# Patient Record
Sex: Male | Born: 1995 | Hispanic: Yes | Marital: Single | Smoking: Never smoker
Health system: Southern US, Community
[De-identification: ages and names within clinical notes are randomized; demographics above are authoritative.]

---

## 2017-04-12 ENCOUNTER — Ambulatory Visit (INDEPENDENT_AMBULATORY_CARE_PROVIDER_SITE_OTHER): Payer: Self-pay | Admitting: Physician Assistant

## 2017-04-12 ENCOUNTER — Encounter (INDEPENDENT_AMBULATORY_CARE_PROVIDER_SITE_OTHER): Payer: Self-pay | Admitting: Physician Assistant

## 2017-04-12 VITALS — BP 109/70 | HR 78 | Temp 99.4°F | Resp 18 | Ht 67.0 in | Wt 233.0 lb

## 2017-04-12 DIAGNOSIS — Z114 Encounter for screening for human immunodeficiency virus [HIV]: Secondary | ICD-10-CM

## 2017-04-12 DIAGNOSIS — Z23 Encounter for immunization: Secondary | ICD-10-CM

## 2017-04-12 DIAGNOSIS — R41840 Attention and concentration deficit: Secondary | ICD-10-CM

## 2017-04-12 DIAGNOSIS — G479 Sleep disorder, unspecified: Secondary | ICD-10-CM

## 2017-04-12 MED ORDER — HYDROXYZINE HCL 25 MG PO TABS: 25.0000 mg | ORAL_TABLET | Freq: Every day | ORAL | 0 refills | Status: DC

## 2017-04-12 NOTE — Patient Instructions (Signed)

## 2017-04-12 NOTE — Progress Notes (Signed)
Subjective:  Patient ID: Mark Proctor, male    DOB: Jul 07, 1995  Age: 22 y.o. MRN: [id number]  CC: separated from reality  HPI Mark Proctor is a 22 y.o. male with no significant medical history presents with concern for lack of focus since approximately two months ago. Has been noticing that he randomly "zooms out". Says he could be driving or sitting in church when he loses focus. Says he feels separated from reality. Also feels his heart racing and is able to feel "the blood flowing through my body".  Also has trouble sleeping since approximately 1.5 months ago. Trouble maintaining sleep, feels "heart beating fast" and a "buzz" "as if I was dreaming or out of body". Has been researching his symptoms online and read that he may have depression or anxiety which he denies having. Smoked marijuana once in November which resulted in paranoia but says he will never smoke again. Reports recommencing the keto diet in January which helped him lose weight previously without the symptoms he is feeling now.  Does not endorse any other symptoms or complaints.     ROS Review of Systems  Constitutional: Negative for chills, fever and malaise/fatigue.  Eyes: Negative for blurred vision.  Respiratory: Negative for shortness of breath.   Cardiovascular: Negative for chest pain and palpitations.       Tachycardia  Gastrointestinal: Negative for abdominal pain and nausea.  Genitourinary: Negative for dysuria and hematuria.  Musculoskeletal: Negative for joint pain and myalgias.  Skin: Negative for rash.  Neurological: Negative for tingling and headaches.  Psychiatric/Behavioral: Negative for depression. The patient is nervous/anxious.     Objective:  BP 109/70 (BP Location: Left Arm, Patient Position: Sitting, Cuff Size: Large)   Pulse 78   Temp 99.4 F (37.4 C) (Oral)   Resp 18   Ht 5\' 7"  (1.702 m)   Wt 233 lb (105.7 kg)   SpO2 99%   BMI 36.49 kg/m   BP/Weight 04/12/2017  Systolic BP 109   Diastolic BP 70  Wt. (Lbs) 233  BMI 36.49      Physical Exam  Constitutional: He is oriented to person, place, and time.  Well developed, overweight, NAD, polite  HENT:  Head: Normocephalic and atraumatic.  Eyes: No scleral icterus.  Neck: Normal range of motion. Neck supple. No thyromegaly present.  Cardiovascular: Normal rate, regular rhythm and normal heart sounds.  Pulmonary/Chest: Effort normal and breath sounds normal.  Musculoskeletal: He exhibits no edema.  Neurological: He is alert and oriented to person, place, and time. No cranial nerve deficit. Coordination normal.  Skin: Skin is warm and dry. No rash noted. No erythema. No pallor.  Psychiatric: He has a normal mood and affect. His behavior is normal. Thought content normal.  Vitals reviewed.    Assessment & Plan:     1. Inattention - CBC with Differential - Comprehensive metabolic panel - TSH  2. Sleep disturbance - CBC with Differential - Comprehensive metabolic panel - TSH - hydrOXYzine (ATARAX/VISTARIL) 25 MG tablet; Take 1 tablet (25 mg total) by mouth at bedtime.  Dispense: 30 tablet; Refill: 0  3. Screening for HIV (human immunodeficiency virus) - HIV antibody  4. Need for Tdap vaccination - Tdap vaccine greater than or equal to 7yo IM  5. Need for prophylactic vaccination and inoculation against influenza - Flu Vaccine QUAD 6+ mos PF IM (Fluarix Quad PF)  * Pt's description of symptoms is vague and convoluted. I suspect there is likely anxiety but will wait  for lab results. Pt will return in two weeks.    Meds ordered this encounter  Medications  . hydrOXYzine (ATARAX/VISTARIL) 25 MG tablet    Sig: Take 1 tablet (25 mg total) by mouth at bedtime.    Dispense:  30 tablet    Refill:  0    Order Specific Question:   Supervising Provider    Answer:   Quentin AngstJEGEDE, OLUGBEMIGA E [id number]]    Follow-up: Return in about 2 weeks (around 04/26/2017).   Loletta Specteroger David Connie Lasater PA

## 2017-04-13 ENCOUNTER — Ambulatory Visit: Payer: Self-pay

## 2017-04-13 ENCOUNTER — Telehealth (INDEPENDENT_AMBULATORY_CARE_PROVIDER_SITE_OTHER): Payer: Self-pay | Admitting: *Deleted

## 2017-04-13 LAB — CBC WITH DIFFERENTIAL/PLATELET
BASOS: 0 %
Basophils Absolute: 0 10*3/uL (ref 0.0–0.2)
EOS (ABSOLUTE): 0 10*3/uL (ref 0.0–0.4)
EOS: 1 %
HEMATOCRIT: 48.3 % (ref 37.5–51.0)
Hemoglobin: 16.2 g/dL (ref 13.0–17.7)
Immature Grans (Abs): 0 10*3/uL (ref 0.0–0.1)
Immature Granulocytes: 0 %
Lymphocytes Absolute: 1.5 10*3/uL (ref 0.7–3.1)
Lymphs: 24 %
MCH: 29.1 pg (ref 26.6–33.0)
MCHC: 33.5 g/dL (ref 31.5–35.7)
MCV: 87 fL (ref 79–97)
MONOS ABS: 0.4 10*3/uL (ref 0.1–0.9)
Monocytes: 7 %
Neutrophils Absolute: 4.3 10*3/uL (ref 1.4–7.0)
Neutrophils: 68 %
Platelets: 235 10*3/uL (ref 150–379)
RBC: 5.57 x10E6/uL (ref 4.14–5.80)
RDW: 13.9 % (ref 12.3–15.4)
WBC: 6.4 10*3/uL (ref 3.4–10.8)

## 2017-04-13 LAB — COMPREHENSIVE METABOLIC PANEL
A/G RATIO: 1.7 (ref 1.2–2.2)
ALK PHOS: 79 IU/L (ref 39–117)
ALT: 23 IU/L (ref 0–44)
AST: 19 IU/L (ref 0–40)
Albumin: 5.1 g/dL (ref 3.5–5.5)
BILIRUBIN TOTAL: 0.7 mg/dL (ref 0.0–1.2)
BUN / CREAT RATIO: 12 (ref 9–20)
BUN: 12 mg/dL (ref 6–20)
CALCIUM: 10.4 mg/dL — AB (ref 8.7–10.2)
CO2: 21 mmol/L (ref 20–29)
Chloride: 104 mmol/L (ref 96–106)
Creatinine, Ser: 1.01 mg/dL (ref 0.76–1.27)
GFR calc Af Amer: 122 mL/min/{1.73_m2} (ref >59)
GFR, EST NON AFRICAN AMERICAN: 106 mL/min/{1.73_m2} (ref >59)
GLOBULIN, TOTAL: 3 g/dL (ref 1.5–4.5)
Glucose: 91 mg/dL (ref 65–99)
Potassium: 4.6 mmol/L (ref 3.5–5.2)
Sodium: 143 mmol/L (ref 134–144)
Total Protein: 8.1 g/dL (ref 6.0–8.5)

## 2017-04-13 LAB — HIV ANTIBODY (ROUTINE TESTING W REFLEX): HIV Screen 4th Generation wRfx: NONREACTIVE

## 2017-04-13 LAB — TSH: TSH: 2.21 u[IU]/mL (ref 0.450–4.500)

## 2017-04-13 NOTE — Telephone Encounter (Signed)
-----   Message from Loletta Specteroger David Gomez, PA-C sent at 04/13/2017 10:15 AM EST ----- Labs normal.

## 2017-04-13 NOTE — Telephone Encounter (Signed)
Medical Assistant left message on patient's home and cell voicemail. Voicemail states to give a call back to Nubia with [facility] at [phone]. Patient is aware of labs being normal  

## 2017-04-26 ENCOUNTER — Encounter (INDEPENDENT_AMBULATORY_CARE_PROVIDER_SITE_OTHER): Payer: Self-pay | Admitting: Physician Assistant

## 2017-04-26 ENCOUNTER — Ambulatory Visit (INDEPENDENT_AMBULATORY_CARE_PROVIDER_SITE_OTHER): Payer: Self-pay | Admitting: Physician Assistant

## 2017-04-26 VITALS — BP 118/73 | HR 78 | Temp 98.0°F | Resp 18 | Ht 67.0 in | Wt 237.0 lb

## 2017-04-26 DIAGNOSIS — F411 Generalized anxiety disorder: Secondary | ICD-10-CM

## 2017-04-26 DIAGNOSIS — R41840 Attention and concentration deficit: Secondary | ICD-10-CM

## 2017-04-26 DIAGNOSIS — G479 Sleep disorder, unspecified: Secondary | ICD-10-CM

## 2017-04-26 MED ORDER — ESCITALOPRAM OXALATE 10 MG PO TABS: 10.0000 mg | ORAL_TABLET | ORAL | 2 refills | Status: DC

## 2017-04-26 MED ORDER — HYDROXYZINE HCL 25 MG PO TABS: 25.0000 mg | ORAL_TABLET | Freq: Every day | ORAL | 0 refills | Status: DC

## 2017-04-26 NOTE — Patient Instructions (Addendum)
Panic Attacks Panic attacks are sudden, short feelings of great fear or discomfort. You may have them for no reason when you are relaxed, when you are uneasy (anxious), or when you are sleeping. Follow these instructions at home:  Take all your medicines as told.  Check with your doctor before starting new medicines.  Keep all doctor visits. Contact a doctor if:  You are not able to take your medicines as told.  Your symptoms do not get better.  Your symptoms get worse. Get help right away if:  Your attacks seem different than your normal attacks.  You have thoughts about hurting yourself or others.  You take panic attack medicine and you have a side effect. This information is not intended to replace advice given to you by your health care provider. Make sure you discuss any questions you have with your health care provider. Document Released: 03/18/2010 Document Revised: 07/22/2015 Document Reviewed: 09/27/2012 Elsevier Interactive Patient Education  2017 Elsevier Inc.  

## 2017-04-26 NOTE — Progress Notes (Signed)
Subjective:  Patient ID: Mark Proctor, male    DOB: [birth year]  Age: 22 y.o. MRN: [id number]  CC: f/u   HPI Mark Proctor is a 22 y.o. male with no significant medical history presents on f/u for inattention and sleep disturbance. CBC, CMP, TSH, and HIV normal/nonreactive. He was prescribed hydroxyzine 25 mg qhs x30 days. Says he is feeling better in regards to "zooming out", fast heartbeat, "buzzing", and the "out of body feeling". Symptoms were nearly everyday but are now approximately once per week. Does not endorse any other symptoms or complaints.     Outpatient Medications Prior to Visit  Medication Sig Dispense Refill  . hydrOXYzine (ATARAX/VISTARIL) 25 MG tablet Take 1 tablet (25 mg total) by mouth at bedtime. 30 tablet 0   No facility-administered medications prior to visit.      ROS Review of Systems  Constitutional: Negative for chills, fever and malaise/fatigue.  Eyes: Negative for blurred vision.  Respiratory: Negative for shortness of breath.   Cardiovascular: Negative for chest pain and palpitations.  Gastrointestinal: Negative for abdominal pain and nausea.  Genitourinary: Negative for dysuria and hematuria.  Musculoskeletal: Negative for joint pain and myalgias.  Skin: Negative for rash.  Neurological: Negative for tingling and headaches.  Psychiatric/Behavioral: Negative for depression. The patient is nervous/anxious and has insomnia.     Objective:  BP 118/73 (BP Location: Left Arm, Patient Position: Sitting, Cuff Size: Large)   Pulse 78   Temp 98 F (36.7 C) (Oral)   Resp 18   Ht 5\' 7"  (1.702 m)   Wt 237 lb (107.5 kg)   SpO2 99%   BMI 37.12 kg/m   BP/Weight 04/26/2017 04/12/2017  Systolic BP 118 109  Diastolic BP 73 70  Wt. (Lbs) 237 233  BMI 37.12 36.49      Physical Exam  Constitutional: He is oriented to person, place, and time.  Well developed, overweight, NAD, polite  HENT:  Head: Normocephalic and atraumatic.  Eyes: No scleral  icterus.  Neck: Normal range of motion. Neck supple. No thyromegaly present.  Cardiovascular: Normal rate, regular rhythm and normal heart sounds.  Pulmonary/Chest: Effort normal and breath sounds normal.  Musculoskeletal: He exhibits no edema.  Neurological: He is alert and oriented to person, place, and time. No cranial nerve deficit. Coordination normal.  Skin: Skin is warm and dry. No rash noted. No erythema. No pallor.  Psychiatric: He has a normal mood and affect. His behavior is normal. Thought content normal.  Vitals reviewed.    Assessment & Plan:    1. Anxiety state - Begin escitalopram 10 mg q am #30 1 refill  2. Inattention - Begin escitalopram 10 mg q am #30 1 refill  3. Sleep disturbance - hydrOXYzine (ATARAX/VISTARIL) 25 MG tablet; Take 1 tablet (25 mg total) by mouth at bedtime.  Dispense: 30 tablet; Refill: 0   Meds ordered this encounter  Medications  . hydrOXYzine (ATARAX/VISTARIL) 25 MG tablet    Sig: Take 1 tablet (25 mg total) by mouth at bedtime.    Dispense:  30 tablet    Refill:  0    Order Specific Question:   Supervising Provider    Answer:   Quentin Angst [id number]  . escitalopram (LEXAPRO) 10 MG tablet    Sig: Take 1 tablet (10 mg total) by mouth every morning.    Dispense:  30 tablet    Refill:  2    Order Specific Question:   Supervising Provider  AnswerQuentin Angst:   JEGEDE, OLUGBEMIGA E [[id number]]    Follow-up: Return in about 6 weeks (around 06/07/2017).   Loletta Specteroger David Gomez PA

## 2017-06-07 ENCOUNTER — Ambulatory Visit (INDEPENDENT_AMBULATORY_CARE_PROVIDER_SITE_OTHER): Payer: Self-pay | Admitting: Physician Assistant

## 2017-06-07 ENCOUNTER — Encounter (INDEPENDENT_AMBULATORY_CARE_PROVIDER_SITE_OTHER): Payer: Self-pay | Admitting: Physician Assistant

## 2017-06-07 ENCOUNTER — Other Ambulatory Visit: Payer: Self-pay

## 2017-06-07 VITALS — BP 106/68 | HR 61 | Temp 97.9°F | Ht 67.0 in | Wt 241.8 lb

## 2017-06-07 DIAGNOSIS — F419 Anxiety disorder, unspecified: Secondary | ICD-10-CM

## 2017-06-07 DIAGNOSIS — F329 Major depressive disorder, single episode, unspecified: Secondary | ICD-10-CM

## 2017-06-07 MED ORDER — ESCITALOPRAM OXALATE 10 MG PO TABS: 10.0000 mg | ORAL_TABLET | ORAL | 0 refills | Status: DC

## 2017-06-07 NOTE — Patient Instructions (Signed)
Living With Depression Everyone experiences occasional disappointment, sadness, and loss in their lives. When you are feeling down, blue, or sad for at least 2 weeks in a row, it may mean that you have depression. Depression can affect your thoughts and feelings, relationships, daily activities, and physical health. It is caused by changes in the way your brain functions. If you receive a diagnosis of depression, your health care provider will tell you which type of depression you have and what treatment options are available to you. If you are living with depression, there are ways to help you recover from it and also ways to prevent it from coming back. How to cope with lifestyle changes Coping with stress Stress is your body's reaction to life changes and events, both good and bad. Stressful situations may include:  Getting married.  The death of a spouse.  Losing a job.  Retiring.  Having a baby.  Stress can last just a few hours or it can be ongoing. Stress can play a major role in depression, so it is important to learn both how to cope with stress and how to think about it differently. Talk with your health care provider or a counselor if you would like to learn more about stress reduction. He or she may suggest some stress reduction techniques, such as:  Music therapy. This can include creating music or listening to music. Choose music that you enjoy and that inspires you.  Mindfulness-based meditation. This kind of meditation can be done while sitting or walking. It involves being aware of your normal breaths, rather than trying to control your breathing.  Centering prayer. This is a kind of meditation that involves focusing on a spiritual word or phrase. Choose a word, phrase, or sacred image that is meaningful to you and that brings you peace.  Deep breathing. To do this, expand your stomach and inhale slowly through your nose. Hold your breath for 3-5 seconds, then exhale  slowly, allowing your stomach muscles to relax.  Muscle relaxation. This involves intentionally tensing muscles then relaxing them.  Choose a stress reduction technique that fits your lifestyle and personality. Stress reduction techniques take time and practice to develop. Set aside 5-15 minutes a day to do them. Therapists can offer training in these techniques. The training may be covered by some insurance plans. Other things you can do to manage stress include:  Keeping a stress diary. This can help you learn what triggers your stress and ways to control your response.  Understanding what your limits are and saying no to requests or events that lead to a schedule that is too full.  Thinking about how you respond to certain situations. You may not be able to control everything, but you can control how you react.  Adding humor to your life by watching funny films or TV shows.  Making time for activities that help you relax and not feeling guilty about spending your time this way.  Medicines Your health care provider may suggest certain medicines if he or she feels that they will help improve your condition. Avoid using alcohol and other substances that may prevent your medicines from working properly (may interact). It is also important to:  Talk with your pharmacist or health care provider about all the medicines that you take, their possible side effects, and what medicines are safe to take together.  Make it your goal to take part in all treatment decisions (shared decision-making). This includes giving input on the side   effects of medicines. It is best if shared decision-making with your health care provider is part of your total treatment plan.  If your health care provider prescribes a medicine, you may not notice the full benefits of it for 4-8 weeks. Most people who are treated for depression need to be on medicine for at least 6-12 months after they feel better. If you are taking  medicines as part of your treatment, do not stop taking medicines without first talking to your health care provider. You may need to have the medicine slowly decreased (tapered) over time to decrease the risk of harmful side effects. Relationships Your health care provider may suggest family therapy along with individual therapy and drug therapy. While there may not be family problems that are causing you to feel depressed, it is still important to make sure your family learns as much as they can about your mental health. Having your family's support can help make your treatment successful. How to recognize changes in your condition Everyone has a different response to treatment for depression. Recovery from major depression happens when you have not had signs of major depression for two months. This may mean that you will start to:  Have more interest in doing activities.  Feel less hopeless than you did 2 months ago.  Have more energy.  Overeat less often, or have better or improving appetite.  Have better concentration.  Your health care provider will work with you to decide the next steps in your recovery. It is also important to recognize when your condition is getting worse. Watch for these signs:  Having fatigue or low energy.  Eating too much or too little.  Sleeping too much or too little.  Feeling restless, agitated, or hopeless.  Having trouble concentrating or making decisions.  Having unexplained physical complaints.  Feeling irritable, angry, or aggressive.  Get help as soon as you or your family members notice these symptoms coming back. How to get support and help from others How to talk with friends and family members about your condition Talking to friends and family members about your condition can provide you with one way to get support and guidance. Reach out to trusted friends or family members, explain your symptoms to them, and let them know that you are  working with a health care provider to treat your depression. Financial resources Not all insurance plans cover mental health care, so it is important to check with your insurance carrier. If paying for co-pays or counseling services is a problem, search for a local or county mental health care center. They may be able to offer public mental health care services at low or no cost when you are not able to see a private health care provider. If you are taking medicine for depression, you may be able to get the generic form, which may be less expensive. Some makers of prescription medicines also offer help to patients who cannot afford the medicines they need. Follow these instructions at home:  Get the right amount and quality of sleep.  Cut down on using caffeine, tobacco, alcohol, and other potentially harmful substances.  Try to exercise, such as walking or lifting small weights.  Take over-the-counter and prescription medicines only as told by your health care provider.  Eat a healthy diet that includes plenty of vegetables, fruits, whole grains, low-fat dairy products, and lean protein. Do not eat a lot of foods that are high in solid fats, added sugars, or salt.    Keep all follow-up visits as told by your health care provider. This is important. Contact a health care provider if:  You stop taking your antidepressant medicines, and you have any of these symptoms: ? Nausea. ? Headache. ? Feeling lightheaded. ? Chills and body aches. ? Not being able to sleep (insomnia).  You or your friends and family think your depression is getting worse. Get help right away if:  You have thoughts of hurting yourself or others. If you ever feel like you may hurt yourself or others, or have thoughts about taking your own life, get help right away. You can go to your nearest emergency department or call:  Your local emergency services (911 in the [place]  A suicide crisis helpline, such as the  National Suicide Prevention Lifeline at 1-800-273-8255. This is open 24-hours a day.  Summary  If you are living with depression, there are ways to help you recover from it and also ways to prevent it from coming back.  Work with your health care team to create a management plan that includes counseling, stress management techniques, and healthy lifestyle habits. This information is not intended to replace advice given to you by your health care provider. Make sure you discuss any questions you have with your health care provider. Document Released: 01/17/2016 Document Revised: 01/17/2016 Document Reviewed: 01/17/2016 Elsevier Interactive Patient Education  2018 Elsevier Inc.  

## 2017-06-07 NOTE — Progress Notes (Signed)
   Subjective:  Patient ID: Mark Proctor, male    DOB: [birth year]  Age: 22 y.o. MRN: [id number]  CC: anxiety  HPI Mark Proctor is a 22 y.o. male with a medical history of anxiety, inattention, and sleep disturbance presents to f/u on these same issues. He was prescribed Escitalopram 10 mg and hydroxyzine 25 mg nearly six weeks ago. Taking medications as directed. Mood is much better and he is sleeping much better, although, maybe a "little too much sleep" Inattention has resolved and focus is much better.        Outpatient Medications Prior to Visit  Medication Sig Dispense Refill  . escitalopram (LEXAPRO) 10 MG tablet Take 1 tablet (10 mg total) by mouth every morning. 30 tablet 2  . hydrOXYzine (ATARAX/VISTARIL) 25 MG tablet Take 1 tablet (25 mg total) by mouth at bedtime. 30 tablet 0   No facility-administered medications prior to visit.      ROS Review of Systems  Constitutional: Negative for chills, fever and malaise/fatigue.  Eyes: Negative for blurred vision.  Respiratory: Negative for shortness of breath.   Cardiovascular: Negative for chest pain and palpitations.  Gastrointestinal: Negative for abdominal pain and nausea.  Genitourinary: Negative for dysuria and hematuria.  Musculoskeletal: Negative for joint pain and myalgias.  Skin: Negative for rash.  Neurological: Negative for tingling and headaches.  Psychiatric/Behavioral: Negative for depression. The patient is not nervous/anxious.     Objective:  BP 106/68 (BP Location: Left Arm, Patient Position: Sitting, Cuff Size: Large)   Pulse 61   Temp 97.9 F (36.6 C) (Oral)   Ht 5\' 7"  (1.702 m)   Wt 241 lb 12.8 oz (109.7 kg)   SpO2 100%   BMI 37.87 kg/m   BP/Weight 06/07/2017 04/26/2017 04/12/2017  Systolic BP 106 118 109  Diastolic BP 68 73 70  Wt. (Lbs) 241.8 237 233  BMI 37.87 37.12 36.49      Physical Exam  Constitutional: He appears well-developed and well-nourished. No distress.  Eyes: No scleral  icterus.  Cardiovascular: Normal rate, regular rhythm and normal heart sounds.  Pulmonary/Chest: Effort normal and breath sounds normal.  Skin: He is not diaphoretic.  Psychiatric: He has a normal mood and affect. His behavior is normal. Thought content normal.     Assessment & Plan:     1. Anxiety and depression - Very good response to escitalopram 10 mg. - Refill escitalopram 10 mg x90 days,zero refills    Meds ordered this encounter  Medications  . escitalopram (LEXAPRO) 10 MG tablet    Sig: Take 1 tablet (10 mg total) by mouth every morning.    Dispense:  90 tablet    Refill:  0    Order Specific Question:   Supervising Provider    Answer:   Quentin AngstJEGEDE, OLUGBEMIGA E [[id number]]    Follow-up: Return in about 3 months (around 09/06/2017) for depression with anxiety.   Loletta Specteroger David Tniya Bowditch PA

## 2017-09-06 ENCOUNTER — Ambulatory Visit (INDEPENDENT_AMBULATORY_CARE_PROVIDER_SITE_OTHER): Payer: Self-pay | Admitting: Physician Assistant

## 2018-08-17 ENCOUNTER — Other Ambulatory Visit: Payer: Self-pay

## 2018-08-17 ENCOUNTER — Emergency Department (HOSPITAL_COMMUNITY): Payer: Self-pay

## 2018-08-17 ENCOUNTER — Emergency Department (HOSPITAL_COMMUNITY)
Admission: EM | Admit: 2018-08-17 | Discharge: 2018-08-17 | Disposition: A | Discharge status: Home / Self Care | Payer: Self-pay | Attending: Emergency Medicine | Admitting: Emergency Medicine

## 2018-08-17 DIAGNOSIS — S61011A Laceration without foreign body of right thumb without damage to nail, initial encounter: Secondary | ICD-10-CM | POA: Insufficient documentation

## 2018-08-17 DIAGNOSIS — Y929 Unspecified place or not applicable: Secondary | ICD-10-CM | POA: Insufficient documentation

## 2018-08-17 DIAGNOSIS — Y93G1 Activity, food preparation and clean up: Secondary | ICD-10-CM | POA: Insufficient documentation

## 2018-08-17 DIAGNOSIS — Y999 Unspecified external cause status: Secondary | ICD-10-CM | POA: Insufficient documentation

## 2018-08-17 DIAGNOSIS — W25XXXA Contact with sharp glass, initial encounter: Secondary | ICD-10-CM | POA: Insufficient documentation

## 2018-08-17 DIAGNOSIS — Z23 Encounter for immunization: Secondary | ICD-10-CM | POA: Insufficient documentation

## 2018-08-17 MED ORDER — LIDOCAINE HCL (PF) 1 % IJ SOLN
10.0000 mL | Freq: Once | INTRAMUSCULAR | Status: AC
  Administered 2018-08-17: 10 mL via INTRADERMAL
  Filled 2018-08-17: qty 10

## 2018-08-17 MED ORDER — BACITRACIN ZINC 500 UNIT/GM EX OINT: TOPICAL_OINTMENT | Freq: Once | CUTANEOUS | Status: AC

## 2018-08-17 MED ORDER — TETANUS-DIPHTH-ACELL PERTUSSIS 5-2.5-18.5 LF-MCG/0.5 IM SUSP
0.5000 mL | Freq: Once | INTRAMUSCULAR | Status: AC
  Administered 2018-08-17: 0.5 mL via INTRAMUSCULAR
  Filled 2018-08-17: qty 0.5

## 2018-08-17 NOTE — ED Notes (Signed)
Patient verbalizes understanding of discharge instructions. Opportunity for questioning and answers were provided. Armband removed by staff, pt discharged from ED.  

## 2018-08-17 NOTE — Discharge Instructions (Signed)
Keep the wound clean and dry for the first 24 hours. After that you may gently clean the wound with soap and water. Make sure to pat dry the wound before covering it with any dressing. You can use topical antibiotic ointment and bandage. Ice and elevate for pain relief.  ° °You can take Tylenol or Ibuprofen as directed for pain. You can alternate Tylenol and Ibuprofen every 4 hours for additional pain relief.  ° °Return to the Emergency Department, your primary care doctor, or the [facility] in 5-7 days for suture removal.  ° °Monitor closely for any signs of infection. Return to the Emergency Department for any worsening redness/swelling of the area that begins to spread, drainage from the site, worsening pain, fever or any other worsening or concerning symptoms.  ° ° °

## 2018-08-17 NOTE — ED Triage Notes (Signed)
Pt  Reports cleaning dishes at home and a glass broke.  Pt has about 2 in lac to right thumb with active bleeding

## 2018-08-17 NOTE — ED Provider Notes (Signed)
[facility] EMERGENCY DEPARTMENT Provider Note   CSN: [id number] Arrival date & time: 08/17/18  1703    History   Chief Complaint Chief Complaint  Patient presents with  . Laceration    HPI Mark Proctor is a 23 y.o. male who presents for evaluation of right thumb laceration that occurred just prior to ED arrival.  He reports that he was washing dishes when a dish broke and cut his thumb.  He states that he has applied pressure to help with bleeding.  He is not currently on blood thinners.  He does not know when his last tetanus shot was.  He denies any numbness/weakness.    The history is provided by the patient.    No past medical history on file.  There are no active problems to display for this patient.    The histories are not reviewed yet. Please review them in the "History" navigator section and refresh this [place].      Home Medications    Prior to Admission medications   Not on File    Family History No family history on file.  Social History Social History   Tobacco Use  . Smoking status: Not on file  Substance Use Topics  . Alcohol use: Not on file  . Drug use: Not on file     Allergies   Patient has no known allergies.   Review of Systems Review of Systems  Skin: Positive for wound.  Neurological: Negative for weakness and numbness.  All other systems reviewed and are negative.    Physical Exam Updated Vital Signs BP (!) 143/85 (BP Location: Right Arm)   Pulse 80   Temp 98.9 F (37.2 C) (Oral)   Resp (!) 22   Ht 5\' 6"  (1.676 m)   Wt 108.9 kg   SpO2 97%   BMI 38.74 kg/m   Physical Exam Vitals signs and nursing note reviewed.  Constitutional:      Appearance: He is well-developed.  HENT:     Head: Normocephalic and atraumatic.  Eyes:     General: No scleral icterus.       Right eye: No discharge.        Left eye: No discharge.     Conjunctiva/sclera: Conjunctivae normal.  Cardiovascular:     Pulses:           Radial pulses are 2+ on the right side and 2+ on the left side.  Pulmonary:     Effort: Pulmonary effort is normal.  Musculoskeletal:     Comments: Flexion/extension of thumb intact without any difficulty.  Opposition, abduction/abduction of thumb intact.  He is able to move the IP joint without any difficulty when held in isolation.  Skin:    General: Skin is warm and dry.     Capillary Refill: Capillary refill takes less than 2 seconds.     Comments: 2 cm linear laceration noted to the dorsal aspect of the right thumb in between the IP and the MCP joint. Good distal cap refill.  RUE is not dusky in appearance or cool to touch.  Neurological:     Mental Status: He is alert.  Psychiatric:        Speech: Speech normal.        Behavior: Behavior normal.      ED Treatments / Results  Labs (all labs ordered are listed, but only abnormal results are displayed) Labs Reviewed - No data to display  EKG None  Radiology  Dg Finger Thumb Right  Result Date: 08/17/2018 CLINICAL DATA:  Thumb laceration EXAM: RIGHT THUMB 2+V COMPARISON:  None. FINDINGS: There is no evidence of fracture or dislocation. There is no evidence of arthropathy or other focal bone abnormality. Soft tissues are unremarkable IMPRESSION: Negative. Electronically Signed   By: Jasmine PangKim  Fujinaga M.D.   On: 08/17/2018 17:38    Procedures .[name].Laceration Repair  Date/Time: 08/17/2018 6:18 PM Performed by: Maxwell CaulLayden, Lindsey A, PA-C Authorized by: Maxwell CaulLayden, Lindsey A, PA-C   Consent:    Consent obtained:  Verbal   Consent given by:  Patient   Risks discussed:  Infection, need for additional repair, pain, poor cosmetic result and poor wound healing   Alternatives discussed:  No treatment and delayed treatment Universal protocol:    Procedure explained and questions answered to patient or proxy's satisfaction: yes     Relevant documents present and verified: yes     Test results available and properly labeled: yes      Imaging studies available: yes     Required blood products, implants, devices, and special equipment available: yes     Site/side marked: yes     Immediately prior to procedure, a time out was called: yes     Patient identity confirmed:  Verbally with patient Anesthesia (see MAR for exact dosages):    Anesthesia method:  Local infiltration and nerve block   Local anesthetic:  Lidocaine 1% w/o epi   Block needle gauge:  25 G   Block anesthetic:  Lidocaine 1% w/o epi   Block injection procedure:  Anatomic landmarks identified, incremental injection, negative aspiration for blood and introduced needle   Block outcome:  Anesthesia achieved Laceration details:    Location:  Finger   Finger location:  R thumb   Length (cm):  2 Repair type:    Repair type:  Intermediate Pre-procedure details:    Preparation:  Patient was prepped and draped in usual sterile fashion Exploration:    Hemostasis achieved with:  Direct pressure   Wound exploration: wound explored through full range of motion     Wound extent: no foreign bodies/material noted and no tendon damage noted   Treatment:    Area cleansed with:  Betadine   Amount of cleaning:  Extensive   Irrigation solution:  Sterile saline   Irrigation method:  Syringe   Visualized foreign bodies/material removed: no   Skin repair:    Repair method:  Sutures   Suture size:  5-0   Suture material:  Nylon   Suture technique:  Simple interrupted   Number of sutures:  5 Approximation:    Approximation:  Close Post-procedure details:    Dressing:  Antibiotic ointment and non-adherent dressing   Patient tolerance of procedure:  Tolerated well, no immediate complications   (including critical care time)  Medications Ordered in ED Medications  lidocaine (PF) (XYLOCAINE) 1 % injection 10 mL (10 mLs Intradermal Given 08/17/18 1759)  Tdap (BOOSTRIX) injection 0.5 mL (0.5 mLs Intramuscular Given 08/17/18 1717)  bacitracin ointment ( Topical Given  08/17/18 1817)     Initial Impression / Assessment and Plan / ED Course  I have reviewed the triage vital signs and the nursing notes.  Pertinent labs & imaging results that were available during my care of the patient were reviewed by me and considered in my medical decision making (see chart for details).         23 year old male who presents for evaluation of right thumb laceration that occurred just  prior to ED arrival.  No numbness/weakness.  Tetanus is up-to-date. Patient is afebrile, non-toxic appearing, sitting comfortably on examination table. Vital signs reviewed and stable.  Patient is neurovascularly intact.  Patient with 2 cm wound noted to right thumb.  Will plan for x-ray, wound care, tetanus update.  X-ray reviewed.  Negative for any acute bony abnormality.  Laceration repaired as documented above.  Patient tolerated procedure well. At this time, patient exhibits no emergent life-threatening condition that require further evaluation in ED or admission. Patient had ample opportunity for questions and discussion. All patient's questions were answered with full understanding. Strict return precautions discussed. Patient expresses understanding and agreement to plan.   Portions of this note were generated with Scientist, clinical (histocompatibility and immunogenetics)Dragon dictation software. Dictation errors may occur despite best attempts at proofreading.   Final Clinical Impressions(s) / ED Diagnoses   Final diagnoses:  Laceration of right thumb without foreign body without damage to nail, initial encounter    ED Discharge Orders    None       Rosana HoesLayden, Lindsey A, PA-C 08/17/18 [id number]    Schlossman, Erin, MD 08/19/18 2121

## 2018-08-24 ENCOUNTER — Other Ambulatory Visit: Payer: Self-pay

## 2018-08-24 ENCOUNTER — Emergency Department (HOSPITAL_COMMUNITY)
Admission: EM | Admit: 2018-08-24 | Discharge: 2018-08-24 | Disposition: A | Discharge status: Home / Self Care | Payer: Self-pay | Attending: Emergency Medicine | Admitting: Emergency Medicine

## 2018-08-24 DIAGNOSIS — W268XXD Contact with other sharp object(s), not elsewhere classified, subsequent encounter: Secondary | ICD-10-CM | POA: Insufficient documentation

## 2018-08-24 DIAGNOSIS — S61011D Laceration without foreign body of right thumb without damage to nail, subsequent encounter: Secondary | ICD-10-CM | POA: Insufficient documentation

## 2018-08-24 DIAGNOSIS — Z5189 Encounter for other specified aftercare: Secondary | ICD-10-CM

## 2018-08-24 NOTE — Discharge Instructions (Addendum)
Stitches are not quite ready to come out today. Recommend recheck in 4 days.

## 2018-08-24 NOTE — ED Notes (Signed)
Patient verbalizes understanding of discharge instructions. Opportunity for questioning and answering were provided.  patient discharged from ED.  

## 2018-08-24 NOTE — ED Triage Notes (Signed)
Pt here for stitch removal to the right thumb; no s/s of infection noted

## 2018-08-24 NOTE — ED Provider Notes (Signed)
[facility] Provider Note   CSN: [id number] Arrival date & time: 08/24/18  1407    History   Chief Complaint Chief Complaint  Patient presents with  . Suture / Staple Removal    HPI Mark Proctor is a 23 y.o. male.     23 year old male presents for suture removal.  Patient seen 1 week ago with 5 sutures placed to his right thumb.  Area appears to be healing well, denies redness or drainage.  No difficulty with range of motion.  No complaints or concerns.     No past medical history on file.  There are no active problems to display for this patient.       Home Medications    Prior to Admission medications   Not on File    Family History No family history on file.  Social History Social History   Tobacco Use  . Smoking status: Not on file  Substance Use Topics  . Alcohol use: Not on file  . Drug use: Not on file     Allergies   Patient has no known allergies.   Review of Systems Review of Systems  Constitutional: Negative for fever.  Musculoskeletal: Negative for arthralgias, joint swelling and myalgias.  Skin: Positive for wound.  Allergic/Immunologic: Negative for immunocompromised state.  Neurological: Negative for weakness and numbness.  All other systems reviewed and are negative.    Physical Exam Updated Vital Signs BP 134/78   Pulse 72   Temp 98.6 F (37 C) (Oral)   Resp 16   SpO2 100%   Physical Exam Vitals signs and nursing note reviewed.  Constitutional:      General: He is not in acute distress.    Appearance: He is well-developed. He is not diaphoretic.  HENT:     Head: Normocephalic and atraumatic.  Cardiovascular:     Pulses: Normal pulses.  Pulmonary:     Effort: Pulmonary effort is normal.  Musculoskeletal:        General: No swelling, tenderness or deformity.       Hands:  Skin:    General: Skin is warm and dry.     Findings: No erythema or rash.  Neurological:   Mental Status: He is alert and oriented to person, place, and time.     Sensory: No sensory deficit.     Motor: No weakness.  Psychiatric:        Behavior: Behavior normal.      ED Treatments / Results  Labs (all labs ordered are listed, but only abnormal results are displayed) Labs Reviewed - No data to display  EKG None  Radiology No results found.  Procedures Procedures (including critical care time)  Medications Ordered in ED Medications - No data to display   Initial Impression / Assessment and Plan / ED Course  I have reviewed the triage vital signs and the nursing notes.  Pertinent labs & imaging results that were available during my care of the patient were reviewed by me and considered in my medical decision making (see chart for details).  Clinical Course as of Aug 24 1430  Sat Aug 24, 2018  [id number] 23 year old male presents for suture removal 7 days after laceration repair right thumb.  2 sutures were removed, noted small area of dehiscence, recommend sutures remain in place for additional 4 days with recheck at that time prior to removal.  No evidence of secondary infection.  Discussed proper wound care with patient.   [  LM]    Clinical Course User Index [LM] Tacy Learn, PA-C      Final Clinical Impressions(s) / ED Diagnoses   Final diagnoses:  Visit for wound check    ED Discharge Orders    None       Tacy Learn, PA-C 08/24/18 1432    Lajean Saver, MD 08/24/18 1721

## 2018-08-29 ENCOUNTER — Other Ambulatory Visit: Payer: Self-pay

## 2018-08-29 ENCOUNTER — Encounter (HOSPITAL_COMMUNITY): Payer: Self-pay | Admitting: Emergency Medicine

## 2018-08-29 ENCOUNTER — Emergency Department (HOSPITAL_COMMUNITY)
Admission: EM | Admit: 2018-08-29 | Discharge: 2018-08-29 | Disposition: A | Discharge status: Home / Self Care | Payer: Self-pay | Attending: Emergency Medicine | Admitting: Emergency Medicine

## 2018-08-29 DIAGNOSIS — S61011D Laceration without foreign body of right thumb without damage to nail, subsequent encounter: Secondary | ICD-10-CM | POA: Insufficient documentation

## 2018-08-29 DIAGNOSIS — X58XXXD Exposure to other specified factors, subsequent encounter: Secondary | ICD-10-CM | POA: Insufficient documentation

## 2018-08-29 DIAGNOSIS — Z4802 Encounter for removal of sutures: Secondary | ICD-10-CM | POA: Insufficient documentation

## 2018-08-29 NOTE — ED Triage Notes (Signed)
Pt was told to return for suture removal.

## 2018-08-29 NOTE — ED Notes (Signed)
ED Provider at bedside. 

## 2018-08-29 NOTE — ED Provider Notes (Signed)
[facility] EMERGENCY DEPARTMENT Provider Note   CSN: [id number] Arrival date & time: 08/29/18  2229    History   Chief Complaint Chief Complaint  Patient presents with  . Suture / Staple Removal    HPI Mark Proctor is a 23 y.o. male who presents to the emergency department for suture removal. On 6/20, patient was seen in the emergency department and 5 sutures placed to his right thumb. He returned to the emergency department on 08/24/2018 for suture removal.  Two sutures were removed 08/24/2018 but there was a small area of wound dehiscence present. It was recommended that patient return in 4 days for a wound recheck and possible suture removal.  They, patient states that his wound has been healing well.  He denies any fever, chills, redness around the wound, or drainage from the wound.  He reports that he has good range of motion of his right thumb.  No known sick contacts.  No medications prior to arrival.     The history is provided by the patient. No language interpreter was used.    History reviewed. No pertinent past medical history.  There are no active problems to display for this patient.   History reviewed. No pertinent surgical history.      Home Medications    Prior to Admission medications   Not on File    Family History No family history on file.  Social History Social History   Tobacco Use  . Smoking status: Not on file  Substance Use Topics  . Alcohol use: Not on file  . Drug use: Not on file     Allergies   Patient has no known allergies.   Review of Systems Review of Systems  Constitutional: Negative for activity change, appetite change, fever and unexpected weight change.  Skin: Positive for wound.  All other systems reviewed and are negative.    Physical Exam Updated Vital Signs BP (!) 160/91   Pulse 85   Temp 98.8 F (37.1 C)   Resp 19   SpO2 100%   Physical Exam Vitals signs and nursing note reviewed.   Constitutional:      General: He is not in acute distress.    Appearance: Normal appearance. He is well-developed. He is not toxic-appearing.  HENT:     Head: Normocephalic and atraumatic.     Right Ear: Tympanic membrane and external ear normal.     Left Ear: Tympanic membrane and external ear normal.     Nose: Nose normal.     Mouth/Throat:     Pharynx: Uvula midline.  Eyes:     General: Lids are normal. No scleral icterus.    Conjunctiva/sclera: Conjunctivae normal.     Pupils: Pupils are equal, round, and reactive to light.  Neck:     Musculoskeletal: Full passive range of motion without pain and neck supple.  Cardiovascular:     Rate and Rhythm: Normal rate.     Heart sounds: Normal heart sounds. No murmur.  Pulmonary:     Effort: Pulmonary effort is normal.     Breath sounds: Normal breath sounds.  Abdominal:     General: Bowel sounds are normal.     Palpations: Abdomen is soft.     Tenderness: There is no abdominal tenderness.  Musculoskeletal: Normal range of motion.     Right wrist: Normal.     Right hand: He exhibits laceration.       Hands:     Comments:  Moving all extremities without difficulty.   Lymphadenopathy:     Cervical: No cervical adenopathy.  Skin:    General: Skin is warm and dry.     Capillary Refill: Capillary refill takes less than 2 seconds.     Findings: Laceration present.     Comments: Well healed laceration present to right thumb with three sutures in place.  Neurological:     Mental Status: He is alert and oriented to person, place, and time.     Coordination: Coordination is intact.     Gait: Gait is intact.      ED Treatments / Results  Labs (all labs ordered are listed, but only abnormal results are displayed) Labs Reviewed - No data to display  EKG None  Radiology No results found.  Procedures .Suture Removal  Date/Time: 08/29/2018 11:22 PM Performed by: Sherrilee GillesScoville, Brittany N, NP Authorized by: Sherrilee GillesScoville, Brittany N, NP    Consent:    Consent obtained:  Verbal   Consent given by:  Patient   Risks discussed:  Pain, wound separation and bleeding   Alternatives discussed:  No treatment Universal protocol:    Site/side marked: yes     Immediately prior to procedure, a time out was called: yes     Patient identity confirmed:  Verbally with patient and arm band Location:    Location:  Upper extremity   Upper extremity location:  Hand   Hand location:  R thumb Procedure details:    Wound appearance:  No signs of infection, good wound healing and clean   Number of sutures removed:  3 Post-procedure details:    Post-removal:  Antibiotic ointment applied and Band-Aid applied   Patient tolerance of procedure:  Tolerated well, no immediate complications   (including critical care time)  Medications Ordered in ED Medications - No data to display   Initial Impression / Assessment and Plan / ED Course  I have reviewed the triage vital signs and the nursing notes.  Pertinent labs & imaging results that were available during my care of the patient were reviewed by me and considered in my medical decision making (see chart for details).        23 year old male who presents for suture removal.  Laceration on patient's right thumb appears well healed.  No fevers or systemic symptoms.  He has good range of motion and brisk CR of his right thumb.  Sutures were removed without immediate complication, see procedure note above for details.  Discussed proper wound care as well as signs and symptoms of wound infection.  Patient verbalizes understanding and was discharged home stable and in good condition.  Discussed supportive care as well as need for f/u w/ PCP in the next 1-2 days.  Also discussed sx that warrant sooner re-evaluation in emergency department. Family / patient/ caregiver informed of clinical course, understand medical decision-making process, and agree with plan.  Final Clinical Impressions(s) / ED  Diagnoses   Final diagnoses:  Visit for suture removal    ED Discharge Orders    None       Sherrilee GillesScoville, Brittany N, NP 08/29/18 2324    Niel HummerKuhner, Ross, MD 08/31/18 [phone]

## 2018-11-06 ENCOUNTER — Encounter (INDEPENDENT_AMBULATORY_CARE_PROVIDER_SITE_OTHER): Payer: Self-pay | Admitting: Physician Assistant

## 2018-11-14 ENCOUNTER — Ambulatory Visit (INDEPENDENT_AMBULATORY_CARE_PROVIDER_SITE_OTHER): Payer: Self-pay | Admitting: Primary Care

## 2018-12-05 ENCOUNTER — Encounter (INDEPENDENT_AMBULATORY_CARE_PROVIDER_SITE_OTHER): Payer: Self-pay | Admitting: Primary Care

## 2018-12-05 ENCOUNTER — Other Ambulatory Visit: Payer: Self-pay

## 2018-12-05 ENCOUNTER — Ambulatory Visit (INDEPENDENT_AMBULATORY_CARE_PROVIDER_SITE_OTHER): Payer: Self-pay | Admitting: Primary Care

## 2018-12-05 ENCOUNTER — Telehealth (INDEPENDENT_AMBULATORY_CARE_PROVIDER_SITE_OTHER): Payer: Self-pay | Admitting: Primary Care

## 2018-12-05 DIAGNOSIS — Z0000 Encounter for general adult medical examination without abnormal findings: Secondary | ICD-10-CM

## 2018-12-05 DIAGNOSIS — G479 Sleep disorder, unspecified: Secondary | ICD-10-CM

## 2018-12-05 DIAGNOSIS — F329 Major depressive disorder, single episode, unspecified: Secondary | ICD-10-CM

## 2018-12-05 DIAGNOSIS — F32A Depression, unspecified: Secondary | ICD-10-CM

## 2018-12-05 DIAGNOSIS — F419 Anxiety disorder, unspecified: Secondary | ICD-10-CM

## 2018-12-05 MED ORDER — ESCITALOPRAM OXALATE 10 MG PO TABS: 10.0000 mg | ORAL_TABLET | Freq: Every day | ORAL | 1 refills | Status: DC

## 2018-12-05 NOTE — Progress Notes (Signed)
Virtual Visit via Telephone Note  I connected with Mark Proctor on 12/05/18 at  4:10 PM EDT by telephone and verified that I am speaking with the correct person using two identifiers.   I discussed the limitations, risks, security and privacy concerns of performing an evaluation and management service by telephone and the availability of in person appointments. I also discussed with the patient that there may be a patient responsible charge related to this service. The patient expressed understanding and agreed to proceed.   History of Present Illness: [name] "Mark Proctor" is having a web visit today. He is estabishling care. His main concerns are mood changes which are described as swings from happy to sad. Also, when he drinks alcohol he says things to his friends that he normally would not say.   History reviewed. No pertinent past medical history. Observations/Objective: Review of Systems  Psychiatric/Behavioral: Positive for depression. The patient is nervous/anxious.     Assessment and Plan: Mark Proctor was seen today for anxiety.  Diagnoses and all orders for this visit:  Anxiety and depression  We discussed options for treatment of anxiety including therapy and/or medication.  Will check basic labs to ensure thyroid is in normal range and that no other metabolic issues are obvious.  Reviewed concept of anxiety as biochemical imbalance of neurotransmitters and rationale for treatment. Discussed potential risks, expected benefits, possible side effects of the medicine. We also discussed how to take it correctly and dosing instructions. If he has any significant side effects to the medicine, he is to stop it and call for advice.  Instructed patient to contact office or on-call physician promptly should condition worsen or any new symptoms appear.    He was agreeable with this plan.  Patient will be started on Lexapro 10mg  and follow up 6 weeks will  be to follow up on effectiveness of medication and to also see CSW at his next visit  Encounter for medical examination to establish care , NP-C will be your  (PCP) that will  provides both the first contact for a person with an undiagnosed health concern as well as continuing care of varied medical conditions, not limited by cause, organ system, or diagnosis.   Sleep disturbance Lexpro 10mg  will sever as a dual purpose side affect is drowsiness and may promote sleep. Insomnia Insomnia is frequent trouble falling and/or staying asleep. Insomnia can be a long term problem or a short term problem. Both are common. Insomnia can be a short term problem when the wakefulness is related to a certain stress or worry. Long term insomnia is often related to ongoing stress during waking hours and/or poor sleeping habits. Overtime, sleep deprivation itself can make the problem worse. Every little thing feels more severe because you are overtired and your ability to cope is decreased. CAUSES   Stress, anxiety, and depression.  Poor sleeping habits.  Distractions such as TV in the bedroom.  Naps close to bedtime.  Engaging in emotionally charged conversations before bed.  Technical reading before sleep.  Alcohol and other sedatives. They may make the problem worse. They can hurt normal sleep patterns and normal dream activity.  Stimulants such as caffeine for several hours prior to bedtime.  Pain syndromes and shortness of breath can cause insomnia.  Exercise late at night.  Changing time zones may cause sleeping problems (jet lag). It is sometimes helpful to have someone observe your sleeping patterns. They should look for periods of  not breathing during the night (sleep apnea). They should also look to see how long those periods last. If you live alone or observers are uncertain, you can also be observed at a sleep clinic where your sleep patterns will be professionally monitored.  Sleep apnea requires a checkup and treatment. Give your caregivers your medical history. Give your caregivers observations your family has made about your sleep.  SYMPTOMS   Not feeling rested in the morning.  Anxiety and restlessness at bedtime.  Difficulty falling and staying asleep. TREATMENT   Your caregiver may prescribe treatment for an underlying medical disorders. Your caregiver can give advice or help if you are using alcohol or other drugs for self-medication. Treatment of underlying problems will usually eliminate insomnia problems.  Medications can be prescribed for short time use. They are generally not recommended for lengthy use.  Over-the-counter sleep medicines are not recommended for lengthy use. They can be habit forming.  You can promote easier sleeping by making lifestyle changes such as:  Using relaxation techniques that help with breathing and reduce muscle tension.  Exercising earlier in the day.  Changing your diet and the time of your last meal. No night time snacks.  Establish a regular time to go to bed.  Counseling can help with stressful problems and worry.  Soothing music and white noise may be helpful if there are background noises you cannot remove.  Stop tedious detailed work at least one hour before bedtime. HOME CARE INSTRUCTIONS   Keep a diary. Inform your caregiver about your progress. This includes any medication side effects. See your caregiver regularly. Take note of:  Times when you are asleep.  Times when you are awake during the night.  The quality of your sleep.  How you feel the next day. This information will help your caregiver care for you.  Get out of bed if you are still awake after 15 minutes. Read or do some quiet activity. Keep the lights down. Wait until you feel sleepy and go back to bed.  Keep regular sleeping and waking hours. Avoid naps.  Exercise regularly.  Avoid distractions at bedtime. Distractions  include watching television or engaging in any intense or detailed activity like attempting to balance the household checkbook.  Develop a bedtime ritual. Keep a familiar routine of bathing, brushing your teeth, climbing into bed at the same time each night, listening to soothing music. Routines increase the success of falling to sleep faster.  Use relaxation techniques. This can be using breathing and muscle tension release routines. It can also include visualizing peaceful scenes. You can also help control troubling or intruding thoughts by keeping your mind occupied with boring or repetitive thoughts like the old concept of counting sheep. You can make it more creative like imagining planting one beautiful flower after another in your backyard garden.  During your day, work to eliminate stress. When this is not possible use some of the previous suggestions to help reduce the anxiety that accompanies stressful situations. MAKE SURE YOU:   Understand these instructions.  Will watch your condition.  Will get help right away if you are not doing well or get worse. Document Released: 02/11/2000 Document Revised: 05/08/2011 Document Reviewed: 03/13/2007 [facility] Patient Information 2015 [place], [place]. This information is not intended to replace advice given to you by your health care provider. Make sure you discuss any questions you have with your health care provider. Other orders -     escitalopram (LEXAPRO) 10 MG tablet; Take  1 tablet (10 mg total) by mouth at bedtime.    Follow Up Instructions:    I discussed the assessment and treatment plan with the patient. The patient was provided an opportunity to ask questions and all were answered. The patient agreed with the plan and demonstrated an understanding of the instructions.   The patient was advised to call back or seek an in-person evaluation if the symptoms worsen or if the condition fails to improve as anticipated.  I provided 26  minutes of non-face-to-face time during this encounter.   Grayce SessionsMichelle P Joshwa Hemric, NP

## 2019-01-14 ENCOUNTER — Ambulatory Visit (INDEPENDENT_AMBULATORY_CARE_PROVIDER_SITE_OTHER): Payer: Self-pay | Admitting: Primary Care

## 2019-01-14 ENCOUNTER — Ambulatory Visit (INDEPENDENT_AMBULATORY_CARE_PROVIDER_SITE_OTHER): Payer: Self-pay | Admitting: Licensed Clinical Social Worker

## 2019-01-14 ENCOUNTER — Other Ambulatory Visit: Payer: Self-pay

## 2019-01-14 ENCOUNTER — Encounter (INDEPENDENT_AMBULATORY_CARE_PROVIDER_SITE_OTHER): Payer: Self-pay | Admitting: Primary Care

## 2019-01-14 VITALS — BP 126/79 | HR 62 | Temp 97.1°F | Ht 66.0 in | Wt 256.2 lb

## 2019-01-14 DIAGNOSIS — F419 Anxiety disorder, unspecified: Secondary | ICD-10-CM

## 2019-01-14 DIAGNOSIS — F439 Reaction to severe stress, unspecified: Secondary | ICD-10-CM

## 2019-01-14 DIAGNOSIS — F329 Major depressive disorder, single episode, unspecified: Secondary | ICD-10-CM

## 2019-01-14 MED ORDER — ESCITALOPRAM OXALATE 10 MG PO TABS: 10.0000 mg | ORAL_TABLET | Freq: Every day | ORAL | 1 refills | Status: DC

## 2019-01-14 NOTE — Patient Instructions (Signed)
Can try melatonin 5mg-15 mg at night for sleep, can also do benadryl 25-50mg at night for sleep.  If this does not help we can try prescription medication.  Also here is some information about good sleep hygiene.   Insomnia Insomnia is frequent trouble falling and/or staying asleep. Insomnia can be a long term problem or a short term problem. Both are common. Insomnia can be a short term problem when the wakefulness is related to a certain stress or worry. Long term insomnia is often related to ongoing stress during waking hours and/or poor sleeping habits. Overtime, sleep deprivation itself can make the problem worse. Every little thing feels more severe because you are overtired and your ability to cope is decreased. CAUSES  Stress, anxiety, and depression. Poor sleeping habits. Distractions such as TV in the bedroom. Naps close to bedtime. Engaging in emotionally charged conversations before bed. Technical reading before sleep. Alcohol and other sedatives. They may make the problem worse. They can hurt normal sleep patterns and normal dream activity. Stimulants such as caffeine for several hours prior to bedtime. Pain syndromes and shortness of breath can cause insomnia. Exercise late at night. Changing time zones may cause sleeping problems (jet lag). It is sometimes helpful to have someone observe your sleeping patterns. They should look for periods of not breathing during the night (sleep apnea). They should also look to see how long those periods last. If you live alone or observers are uncertain, you can also be observed at a sleep clinic where your sleep patterns will be professionally monitored. Sleep apnea requires a checkup and treatment. Give your caregivers your medical history. Give your caregivers observations your family has made about your sleep.  SYMPTOMS  Not feeling rested in the morning. Anxiety and restlessness at bedtime. Difficulty falling and staying asleep. TREATMENT   Your caregiver may prescribe treatment for an underlying medical disorders. Your caregiver can give advice or help if you are using alcohol or other drugs for self-medication. Treatment of underlying problems will usually eliminate insomnia problems. Medications can be prescribed for short time use. They are generally not recommended for lengthy use. Over-the-counter sleep medicines are not recommended for lengthy use. They can be habit forming. You can promote easier sleeping by making lifestyle changes such as: Using relaxation techniques that help with breathing and reduce muscle tension. Exercising earlier in the day. Changing your diet and the time of your last meal. No night time snacks. Establish a regular time to go to bed. Counseling can help with stressful problems and worry. Soothing music and white noise may be helpful if there are background noises you cannot remove. Stop tedious detailed work at least one hour before bedtime. HOME CARE INSTRUCTIONS  Keep a diary. Inform your caregiver about your progress. This includes any medication side effects. See your caregiver regularly. Take note of: Times when you are asleep. Times when you are awake during the night. The quality of your sleep. How you feel the next day. This information will help your caregiver care for you. Get out of bed if you are still awake after 15 minutes. Read or do some quiet activity. Keep the lights down. Wait until you feel sleepy and go back to bed. Keep regular sleeping and waking hours. Avoid naps. Exercise regularly. Avoid distractions at bedtime. Distractions include watching television or engaging in any intense or detailed activity like attempting to balance the household checkbook. Develop a bedtime ritual. Keep a familiar routine of bathing, brushing your teeth,   climbing into bed at the same time each night, listening to soothing music. Routines increase the success of falling to sleep faster. Use  relaxation techniques. This can be using breathing and muscle tension release routines. It can also include visualizing peaceful scenes. You can also help control troubling or intruding thoughts by keeping your mind occupied with boring or repetitive thoughts like the old concept of counting sheep. You can make it more creative like imagining planting one beautiful flower after another in your backyard garden. During your day, work to eliminate stress. When this is not possible use some of the previous suggestions to help reduce the anxiety that accompanies stressful situations. MAKE SURE YOU:  Understand these instructions. Will watch your condition. Will get help right away if you are not doing well or get worse. Document Released: 02/11/2000 Document Revised: 05/08/2011 Document Reviewed: 03/13/2007 [employer] Patient Information 2015 [employer]. This information is not intended to replace advice given to you by your health care provider. Make sure you discuss any questions you have with your health care provider.  

## 2019-01-14 NOTE — Progress Notes (Signed)
Established Patient Office Visit  Subjective:  Patient ID: Mark Proctor, male    DOB: 05-31-1995  Age: 23 y.o. MRN: [id number]  CC:  Chief Complaint  Patient presents with  . Depression  . Anxiety    HPI Mark Proctor presents for evaluation on Lexapro he has seen improvement in feeling better , sleeping better but notices he is tired all the time. However he works 52 hours a week if he takes a nap during the day he can not sleep at night suggested exercising.   No past medical history on file.  No past surgical history on file.  No family history on file.  Social History   Socioeconomic History  . Marital status: Single    Spouse name: Not on file  . Number of children: Not on file  . Years of education: Not on file  . Highest education level: Not on file  Occupational History  . Not on file  Social Needs  . Financial resource strain: Not on file  . Food insecurity    Worry: Not on file    Inability: Not on file  . Transportation needs    Medical: Not on file    Non-medical: Not on file  Tobacco Use  . Smoking status: Never Smoker  . Smokeless tobacco: Never Used  Substance and Sexual Activity  . Alcohol use: Yes    Frequency: Never  . Drug use: No  . Sexual activity: Not Currently  Lifestyle  . Physical activity    Days per week: Not on file    Minutes per session: Not on file  . Stress: Not on file  Relationships  . Social Herbalist on phone: Not on file    Gets together: Not on file    Attends religious service: Not on file    Active member of club or organization: Not on file    Attends meetings of clubs or organizations: Not on file    Relationship status: Not on file  . Intimate partner violence    Fear of current or ex partner: Not on file    Emotionally abused: Not on file    Physically abused: Not on file    Forced sexual activity: Not on file  Other Topics Concern  . Not on file  Social History  Narrative   ** Merged History Encounter **        Outpatient Medications Prior to Visit  Medication Sig Dispense Refill  . escitalopram (LEXAPRO) 10 MG tablet Take 1 tablet (10 mg total) by mouth at bedtime. 30 tablet 1   No facility-administered medications prior to visit.     No Known Allergies  ROS Review of Systems  Constitutional: Positive for fatigue.  Psychiatric/Behavioral: Positive for sleep disturbance.  All other systems reviewed and are negative.     Objective:    Physical Exam  Constitutional: He is oriented to person, place, and time. He appears well-developed and well-nourished.  Neck: Neck supple.  Cardiovascular: Normal rate and regular rhythm.  Pulmonary/Chest: Effort normal and breath sounds normal.  Abdominal: Soft. Bowel sounds are normal. He exhibits distension.  Musculoskeletal: Normal range of motion.  Neurological: He is oriented to person, place, and time.  Psychiatric: He has a normal mood and affect. His behavior is normal.    BP 126/79 (BP Location: Left Arm, Patient Position: Sitting, Cuff Size: Large)   Pulse 62   Temp (!) 97.1 F (36.2 C) (Temporal)  Ht 5\' 6"  (1.676 m)   Wt 256 lb 3.2 oz (116.2 kg)   SpO2 98%   BMI 41.35 kg/m  Wt Readings from Last 3 Encounters:  01/14/19 256 lb 3.2 oz (116.2 kg)  08/17/18 240 lb (108.9 kg)  06/07/17 241 lb 12.8 oz (109.7 kg)     Health Maintenance Due  Topic Date Due  . INFLUENZA VACCINE  09/28/2018    There are no preventive care reminders to display for this patient.  Lab Results  Component Value Date   TSH 2.210 04/12/2017   Lab Results  Component Value Date   WBC 6.4 04/12/2017   HGB 16.2 04/12/2017   HCT 48.3 04/12/2017   MCV 87 04/12/2017   PLT 235 04/12/2017   Lab Results  Component Value Date   NA 143 04/12/2017   K 4.6 04/12/2017   CO2 21 04/12/2017   GLUCOSE 91 04/12/2017   BUN 12 04/12/2017   CREATININE 1.01 04/12/2017   BILITOT 0.7 04/12/2017   ALKPHOS 79  04/12/2017   AST 19 04/12/2017   ALT 23 04/12/2017   PROT 8.1 04/12/2017   ALBUMIN 5.1 04/12/2017   CALCIUM 10.4 (H) 04/12/2017   No results found for: CHOL No results found for: HDL No results found for: LDLCALC No results found for: TRIG No results found for: CHOLHDL No results found for: 04/14/2017    Assessment & Plan:  Mark Proctor was seen today for depression and anxiety.  Diagnoses and all orders for this visit:  Anxiety and depressiongettin Patient is expressing his depression is a lot better since he has started on Lexapro  feeling down, blue or irritability has decreased. He will also following up with the CSW. Other orders -     escitalopram (LEXAPRO) 10 MG tablet; Take 1 tablet (10 mg total) by mouth at bedtime.continue with no changes.    No orders of the defined types were placed in this encounter.   Follow-up: No follow-ups on file.    Elita Quick, NP

## 2019-01-17 NOTE — BH Specialist Note (Signed)
Integrated Behavioral Health Initial Visit  MRN: [id number] Name: Mark Proctor  Number of [place] Clinician visits:: 1/6 Session Start time: 10:00 AM  Session End time: 10:30 AM Total time: 30  Type of Service: [place] Interpretor:No. Interpretor Name and Language: NA   Warm Hand Off Completed.       SUBJECTIVE: Mark Proctor is a 23 y.o. male accompanied by self Patient was referred by NP Oletta Lamas for depression and anxiety. Patient reports the following symptoms/concerns: Pt reports increase in irritability, low energy, and panic attacks triggered by work stress Duration of problem: Ongoing; Severity of problem: mild  OBJECTIVE: Mood: Anxious and Pleasant and Affect: Appropriate Risk of harm to self or others: No plan to harm self or others  LIFE CONTEXT: Family and Social: Pt reports strong support from family and friends School/Work: Pt is employed Self-Care: Pt enjoys listening to music and videos online to cope with stressors. He also utilizes essential oils Life Changes: Pt is experiencing work stress resulting in symptoms of depression and anxiety  GOALS ADDRESSED: Patient will: 1. Reduce symptoms of: agitation, anxiety, depression and stress 2. Increase knowledge and/or ability of: coping skills and healthy habits  3. Demonstrate ability to: Increase healthy adjustment to current life circumstances and Increase adequate support systems for patient/family  INTERVENTIONS: Interventions utilized: Mindfulness or Psychologist, educational, Supportive Counseling and Psychoeducation and/or Health Education  Standardized Assessments completed: GAD-7 and PHQ 2&9  ASSESSMENT: Patient currently experiencing increase in irritability, low energy, and panic attacks triggered by work stress. He receives strong support from family and friends.    Patient may benefit from continued medication management.  LCSW discussed how stress can negatively impact our mental and physical health, in addition, to healthy coping skills to assist in management or decrease of stressors. Pt successfully identified strategies that he can utilize on a routine basis.   PLAN: 1. Follow up with behavioral health clinician on : Contact LCSW with any behavioral health or resource needs 2. Behavioral recommendations: Continue medication management and utilize healthy coping skills discussed 3. Referral(s): [place] (In Clinic) 4. "From scale of 1-10, how likely are you to follow plan?": 10  Rebekah Chesterfield, LCSW 01/17/2019 11:48 AM

## 2019-07-15 ENCOUNTER — Other Ambulatory Visit: Payer: Self-pay

## 2019-07-15 ENCOUNTER — Ambulatory Visit (INDEPENDENT_AMBULATORY_CARE_PROVIDER_SITE_OTHER): Payer: Self-pay | Admitting: Licensed Clinical Social Worker

## 2019-07-15 ENCOUNTER — Telehealth (INDEPENDENT_AMBULATORY_CARE_PROVIDER_SITE_OTHER): Payer: Self-pay | Admitting: Primary Care

## 2019-07-15 DIAGNOSIS — F419 Anxiety disorder, unspecified: Secondary | ICD-10-CM

## 2019-07-15 DIAGNOSIS — F329 Major depressive disorder, single episode, unspecified: Secondary | ICD-10-CM

## 2019-07-15 MED ORDER — ESCITALOPRAM OXALATE 10 MG PO TABS: 10.0000 mg | ORAL_TABLET | Freq: Every day | ORAL | 1 refills | Status: DC

## 2019-07-15 NOTE — Progress Notes (Signed)
Virtual Visit via Telephone Note  I connected with Mark Proctor on 07/15/19 at 10:30 AM EDT by telephone and verified that I am speaking with the correct person using two identifiers.   I discussed the limitations, risks, security and privacy concerns of performing an evaluation and management service by telephone and the availability of in person appointments. I also discussed with the patient that there may be a patient responsible charge related to this service. The patient expressed understanding and agreed to proceed.   History of Present Illness: Ms. Mark Proctor is a 24 year old man having a follow up appointment for effectiveness of lexapro. He states he is feeling an improvement and does not feel a increase is needed. He is following up with CSW. Past Medical History   Depression  Anxiety Observations/Objective: Review of Systems  All other systems reviewed and are negative.   Assessment and Plan: Diagnoses and all orders for this visit: Diagnoses and all orders for this visit:  Anxiety and depression Anxiety and depression SRI Side Effects: Lexapro 10 at bedtime  GI Upset:  no Change in Appetite:  no Daytime Drowsiness:  yes Sleep Issues:  no Headaches:  no Dizziness:  no Tremor:  no Heart Palpitations:  no Sweating:  no Irritability:  yes Decreased Libido:  no Patient compliant with medication:  yes Suicidal Ideation:  no Self Harm:  no -     escitalopram (LEXAPRO) 10 MG tablet; Take 1 tablet (10 mg total) by mouth at bedtime.  Other orders -     escitalopram (LEXAPRO) 10 MG tablet; Take 1 tablet (10 mg total) by mouth at bedtime.    Follow Up Instructions:    I discussed the assessment and treatment plan with the patient. The patient was provided an opportunity to ask questions and all were answered. The patient agreed with the plan and demonstrated an understanding of the instructions.   The patient was advised to call back or  seek an in-person evaluation if the symptoms worsen or if the condition fails to improve as anticipated.  I provided 10 minutes of non-face-to-face time during this encounter.   Grayce Sessions, NP

## 2019-08-11 NOTE — BH Specialist Note (Signed)
Integrated Behavioral Health Follow Up Visit  MRN: [id number] Name: Mark Proctor  Number of Integrated Behavioral Health Clinician visits: 2/6 Session Start time: 11:00 AM  Session End time: 11:10 AM Total time: 10  Type of Service: Integrated Behavioral Health- Individual Interpretor:No. Interpretor Name and Language: NA  SUBJECTIVE: Mark Proctor is a 24 y.o. male accompanied by self Patient was referred by NP Randa Evens for depression. Patient reports the following symptoms/concerns: Patient reports decrease in depression anxiety symptoms. Shared that he has been utilizing healthy coping skills and compliant with medication management Duration of problem: N/A; Severity of problem: mild  OBJECTIVE: Mood: Pleasant and Affect: Appropriate Risk of harm to self or others: No plan to harm self or others  LIFE CONTEXT: Family and Social: Patient receives support from family and friends School/Work: Patient is employed Self-Care: Patient is participating medication management Life Changes: Patient has been successfully managing mental and physical health conditions  GOALS ADDRESSED: Patient will: 1.  Reduce symptoms of: anxiety and depression  2.  Increase knowledge and/or ability of: self-management skills  3.  Demonstrate ability to: Increase adequate support systems for patient/family  INTERVENTIONS: Interventions utilized:  Supportive Counseling Standardized Assessments completed: Not Needed  ASSESSMENT: Patient currently experiencing decrease in depression anxiety symptoms. He reports positive increase in mood and functioning.   Patient may benefit from continued medication management and utilization of healthy coping skills.  PLAN: 1. Follow up with behavioral health clinician on : Contact LCSW with any behavioral health and/or resource needs 2. Behavioral recommendations: Continue compliance with medication management 3. Referral(s): Integrated  Behavioral Health Services (In Clinic) 4. "From scale of 1-10, how likely are you to follow plan?":   Bridgett Larsson, LCSW 08/11/2019 7:35 AM

## 2019-09-04 ENCOUNTER — Emergency Department (HOSPITAL_COMMUNITY)
Admission: EM | Admit: 2019-09-04 | Discharge: 2019-09-06 | Disposition: A | Discharge status: Other Acute Inpatient Hospital | Payer: Self-pay | Attending: Emergency Medicine | Admitting: Emergency Medicine

## 2019-09-04 ENCOUNTER — Emergency Department (HOSPITAL_COMMUNITY): Payer: PRIVATE HEALTH INSURANCE

## 2019-09-04 ENCOUNTER — Encounter (HOSPITAL_COMMUNITY): Payer: Self-pay | Admitting: Emergency Medicine

## 2019-09-04 ENCOUNTER — Other Ambulatory Visit: Payer: Self-pay

## 2019-09-04 DIAGNOSIS — R0789 Other chest pain: Secondary | ICD-10-CM | POA: Insufficient documentation

## 2019-09-04 DIAGNOSIS — Y939 Activity, unspecified: Secondary | ICD-10-CM | POA: Insufficient documentation

## 2019-09-04 DIAGNOSIS — R410 Disorientation, unspecified: Secondary | ICD-10-CM | POA: Insufficient documentation

## 2019-09-04 DIAGNOSIS — Z20822 Contact with and (suspected) exposure to covid-19: Secondary | ICD-10-CM | POA: Insufficient documentation

## 2019-09-04 DIAGNOSIS — R109 Unspecified abdominal pain: Secondary | ICD-10-CM | POA: Insufficient documentation

## 2019-09-04 DIAGNOSIS — Y999 Unspecified external cause status: Secondary | ICD-10-CM | POA: Insufficient documentation

## 2019-09-04 DIAGNOSIS — F10929 Alcohol use, unspecified with intoxication, unspecified: Secondary | ICD-10-CM | POA: Insufficient documentation

## 2019-09-04 DIAGNOSIS — Y908 Blood alcohol level of 240 mg/100 ml or more: Secondary | ICD-10-CM | POA: Insufficient documentation

## 2019-09-04 DIAGNOSIS — F332 Major depressive disorder, recurrent severe without psychotic features: Secondary | ICD-10-CM | POA: Diagnosis present

## 2019-09-04 DIAGNOSIS — F3110 Bipolar disorder, current episode manic without psychotic features, unspecified: Secondary | ICD-10-CM | POA: Diagnosis present

## 2019-09-04 DIAGNOSIS — Z79899 Other long term (current) drug therapy: Secondary | ICD-10-CM | POA: Insufficient documentation

## 2019-09-04 DIAGNOSIS — Y929 Unspecified place or not applicable: Secondary | ICD-10-CM | POA: Insufficient documentation

## 2019-09-04 DIAGNOSIS — T1491XA Suicide attempt, initial encounter: Secondary | ICD-10-CM | POA: Diagnosis present

## 2019-09-04 LAB — CBC
HCT: 46.7 % (ref 39.0–52.0)
Hemoglobin: 15.7 g/dL (ref 13.0–17.0)
MCH: 29.8 pg (ref 26.0–34.0)
MCHC: 33.6 g/dL (ref 30.0–36.0)
MCV: 88.8 fL (ref 80.0–100.0)
Platelets: 231 10*3/uL (ref 150–400)
RBC: 5.26 MIL/uL (ref 4.22–5.81)
RDW: 12.7 % (ref 11.5–15.5)
WBC: 6.5 10*3/uL (ref 4.0–10.5)
nRBC: 0 % (ref 0.0–0.2)

## 2019-09-04 LAB — URINALYSIS, ROUTINE W REFLEX MICROSCOPIC
Bilirubin Urine: NEGATIVE
Glucose, UA: NEGATIVE mg/dL
Hgb urine dipstick: NEGATIVE
Ketones, ur: NEGATIVE mg/dL
Leukocytes,Ua: NEGATIVE
Nitrite: NEGATIVE
Protein, ur: NEGATIVE mg/dL
Specific Gravity, Urine: >1.046 — ABNORMAL HIGH (ref 1.005–1.030)
pH: 5 (ref 5.0–8.0)

## 2019-09-04 LAB — RAPID URINE DRUG SCREEN, HOSP PERFORMED
Amphetamines: NOT DETECTED
Barbiturates: NOT DETECTED
Benzodiazepines: NOT DETECTED
Cocaine: NOT DETECTED
Opiates: NOT DETECTED
Tetrahydrocannabinol: NOT DETECTED

## 2019-09-04 LAB — COMPREHENSIVE METABOLIC PANEL
ALT: 52 U/L — ABNORMAL HIGH (ref 0–44)
AST: 38 U/L (ref 15–41)
Albumin: 4.6 g/dL (ref 3.5–5.0)
Alkaline Phosphatase: 77 U/L (ref 38–126)
Anion gap: 12 (ref 5–15)
BUN: 10 mg/dL (ref 6–20)
CO2: 25 mmol/L (ref 22–32)
Calcium: 9 mg/dL (ref 8.9–10.3)
Chloride: 108 mmol/L (ref 98–111)
Creatinine, Ser: 1.25 mg/dL — ABNORMAL HIGH (ref 0.61–1.24)
GFR calc Af Amer: >60 mL/min (ref >60)
GFR calc non Af Amer: >60 mL/min (ref >60)
Glucose, Bld: 112 mg/dL — ABNORMAL HIGH (ref 70–99)
Potassium: 4 mmol/L (ref 3.5–5.1)
Sodium: 145 mmol/L (ref 135–145)
Total Bilirubin: 0.6 mg/dL (ref 0.3–1.2)
Total Protein: 8 g/dL (ref 6.5–8.1)

## 2019-09-04 LAB — LIPASE, BLOOD: Lipase: 74 U/L — ABNORMAL HIGH (ref 11–51)

## 2019-09-04 LAB — ETHANOL: Alcohol, Ethyl (B): 273 mg/dL — ABNORMAL HIGH (ref <10)

## 2019-09-04 LAB — SARS CORONAVIRUS 2 BY RT PCR (HOSPITAL ORDER, PERFORMED IN ~~LOC~~ HOSPITAL LAB): SARS Coronavirus 2: NEGATIVE

## 2019-09-04 MED ORDER — THIAMINE HCL 100 MG PO TABS
100.0000 mg | ORAL_TABLET | Freq: Every day | ORAL | Status: DC
  Administered 2019-09-04 – 2019-09-05 (×2): 100 mg via ORAL
  Filled 2019-09-04 (×2): qty 1

## 2019-09-04 MED ORDER — LORAZEPAM 2 MG/ML IJ SOLN: 0.0000 mg | Freq: Four times a day (QID) | INTRAMUSCULAR | Status: DC

## 2019-09-04 MED ORDER — SODIUM CHLORIDE 0.9 % IV BOLUS
1000.0000 mL | Freq: Once | INTRAVENOUS | Status: AC
  Administered 2019-09-04: 1000 mL via INTRAVENOUS

## 2019-09-04 MED ORDER — LORAZEPAM 2 MG/ML IJ SOLN: 0.0000 mg | Freq: Two times a day (BID) | INTRAMUSCULAR | Status: DC

## 2019-09-04 MED ORDER — THIAMINE HCL 100 MG/ML IJ SOLN: 100.0000 mg | Freq: Every day | INTRAMUSCULAR | Status: DC

## 2019-09-04 MED ORDER — IOHEXOL 300 MG/ML  SOLN
100.0000 mL | Freq: Once | INTRAMUSCULAR | Status: AC | PRN
  Administered 2019-09-04: 100 mL via INTRAVENOUS

## 2019-09-04 MED ORDER — LORAZEPAM 1 MG PO TABS
0.0000 mg | ORAL_TABLET | Freq: Four times a day (QID) | ORAL | Status: DC
  Administered 2019-09-04: 1 mg via ORAL
  Filled 2019-09-04: qty 1

## 2019-09-04 MED ORDER — LORAZEPAM 1 MG PO TABS: 0.0000 mg | ORAL_TABLET | Freq: Two times a day (BID) | ORAL | Status: DC

## 2019-09-04 MED ORDER — SODIUM CHLORIDE (PF) 0.9 % IJ SOLN
INTRAMUSCULAR | Status: AC
  Filled 2019-09-04: qty 50
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
## 2019-09-04 NOTE — ED Notes (Signed)
Myrtis Ser MD at bedside. C collar removed.

## 2019-09-04 NOTE — ED Notes (Signed)
Pt aware urine sample needed 

## 2019-09-04 NOTE — Care Management (Signed)
  Writer referred patient to the following hospitals:   [facility]  [facility]  [facility]   [facility]  [facility]  [facility]  [facility]  [facility]  [facility]
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
## 2019-09-04 NOTE — ED Notes (Signed)
Pt ambulatory to RR. Sample cup provided. Paperwork, citation, and cell phone given to [employer]. Pt denies SI at this time.

## 2019-09-05 DIAGNOSIS — T1491XA Suicide attempt, initial encounter: Secondary | ICD-10-CM | POA: Diagnosis present

## 2019-09-05 DIAGNOSIS — F3110 Bipolar disorder, current episode manic without psychotic features, unspecified: Secondary | ICD-10-CM | POA: Diagnosis present

## 2019-09-05 MED ORDER — SERTRALINE HCL 50 MG PO TABS
25.0000 mg | ORAL_TABLET | Freq: Every day | ORAL | Status: DC
  Administered 2019-09-05 – 2019-09-06 (×2): 25 mg via ORAL
  Filled 2019-09-05 (×2): qty 1

## 2019-09-05 NOTE — BH Assessment (Signed)
[facility] Assessment Progress Note  Per Caryn Bee, DNP, this pt requires psychiatric hospitalization at this time.  Pt presents under IVC initiated by EDP Cherlynn Perches, MD.  The following facilities have been contacted to seek placement for this pt, with results as noted:  Beds available, information sent, decision pending: Horace Porteous, [place] [facility] Coordinator [phone]
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
## 2019-09-06 ENCOUNTER — Other Ambulatory Visit: Payer: Self-pay

## 2019-09-06 ENCOUNTER — Inpatient Hospital Stay (HOSPITAL_COMMUNITY)
Admission: AD | Admit: 2019-09-06 | Discharge: 2019-09-09 | DRG: 885 | Disposition: A | Discharge status: Home / Self Care | Payer: Federal, State, Local not specified - Other | Source: Intra-hospital | Attending: Psychiatry | Admitting: Psychiatry

## 2019-09-06 ENCOUNTER — Encounter (HOSPITAL_COMMUNITY): Payer: Self-pay | Admitting: Psychiatry

## 2019-09-06 DIAGNOSIS — F1020 Alcohol dependence, uncomplicated: Secondary | ICD-10-CM | POA: Diagnosis not present

## 2019-09-06 DIAGNOSIS — F332 Major depressive disorder, recurrent severe without psychotic features: Secondary | ICD-10-CM | POA: Diagnosis present

## 2019-09-06 DIAGNOSIS — R45851 Suicidal ideations: Secondary | ICD-10-CM | POA: Diagnosis present

## 2019-09-06 DIAGNOSIS — F1024 Alcohol dependence with alcohol-induced mood disorder: Secondary | ICD-10-CM | POA: Diagnosis present

## 2019-09-06 DIAGNOSIS — Z915 Personal history of self-harm: Secondary | ICD-10-CM

## 2019-09-06 DIAGNOSIS — Y908 Blood alcohol level of 240 mg/100 ml or more: Secondary | ICD-10-CM | POA: Diagnosis present

## 2019-09-06 LAB — COMPREHENSIVE METABOLIC PANEL
ALT: 45 U/L — ABNORMAL HIGH (ref 0–44)
AST: 34 U/L (ref 15–41)
Albumin: 4.2 g/dL (ref 3.5–5.0)
Alkaline Phosphatase: 66 U/L (ref 38–126)
Anion gap: 9 (ref 5–15)
BUN: 14 mg/dL (ref 6–20)
CO2: 28 mmol/L (ref 22–32)
Calcium: 9.2 mg/dL (ref 8.9–10.3)
Chloride: 104 mmol/L (ref 98–111)
Creatinine, Ser: 1.03 mg/dL (ref 0.61–1.24)
GFR calc Af Amer: >60 mL/min (ref >60)
GFR calc non Af Amer: >60 mL/min (ref >60)
Glucose, Bld: 108 mg/dL — ABNORMAL HIGH (ref 70–99)
Potassium: 4 mmol/L (ref 3.5–5.1)
Sodium: 141 mmol/L (ref 135–145)
Total Bilirubin: 1.1 mg/dL (ref 0.3–1.2)
Total Protein: 7.5 g/dL (ref 6.5–8.1)

## 2019-09-06 LAB — CBC
HCT: 46.7 % (ref 39.0–52.0)
Hemoglobin: 15.3 g/dL (ref 13.0–17.0)
MCH: 29.4 pg (ref 26.0–34.0)
MCHC: 32.8 g/dL (ref 30.0–36.0)
MCV: 89.8 fL (ref 80.0–100.0)
Platelets: 212 10*3/uL (ref 150–400)
RBC: 5.2 MIL/uL (ref 4.22–5.81)
RDW: 12.6 % (ref 11.5–15.5)
WBC: 5.3 10*3/uL (ref 4.0–10.5)
nRBC: 0 % (ref 0.0–0.2)

## 2019-09-06 LAB — MAGNESIUM: Magnesium: 2.2 mg/dL (ref 1.7–2.4)

## 2019-09-06 LAB — PHOSPHORUS: Phosphorus: 2.7 mg/dL (ref 2.5–4.6)

## 2019-09-06 MED ORDER — ESCITALOPRAM OXALATE 10 MG PO TABS
10.0000 mg | ORAL_TABLET | Freq: Every day | ORAL | Status: DC
  Administered 2019-09-07 – 2019-09-09 (×3): 10 mg via ORAL
  Filled 2019-09-06 (×3): qty 1
  Filled 2019-09-06: qty 7
  Filled 2019-09-06 (×2): qty 1

## 2019-09-06 MED ORDER — ONDANSETRON 4 MG PO TBDP: 4.0000 mg | ORAL_TABLET | Freq: Four times a day (QID) | ORAL | Status: DC | PRN

## 2019-09-06 MED ORDER — THIAMINE HCL 100 MG/ML IJ SOLN: 100.0000 mg | Freq: Once | INTRAMUSCULAR | Status: DC

## 2019-09-06 MED ORDER — ADULT MULTIVITAMIN W/MINERALS CH
1.0000 | ORAL_TABLET | Freq: Every day | ORAL | Status: DC
  Administered 2019-09-07 – 2019-09-09 (×3): 1 via ORAL
  Filled 2019-09-06 (×4): qty 1
  Filled 2019-09-06: qty 7

## 2019-09-06 MED ORDER — HYDROXYZINE HCL 25 MG PO TABS
25.0000 mg | ORAL_TABLET | Freq: Four times a day (QID) | ORAL | Status: DC | PRN
  Administered 2019-09-06: 25 mg via ORAL
  Filled 2019-09-06 (×3): qty 1

## 2019-09-06 MED ORDER — ADULT MULTIVITAMIN W/MINERALS CH
1.0000 | ORAL_TABLET | Freq: Every day | ORAL | Status: DC
  Administered 2019-09-06: 1 via ORAL
  Filled 2019-09-06: qty 1

## 2019-09-06 MED ORDER — LOPERAMIDE HCL 2 MG PO CAPS: 2.0000 mg | ORAL_CAPSULE | ORAL | Status: DC | PRN

## 2019-09-06 MED ORDER — THIAMINE HCL 100 MG PO TABS
100.0000 mg | ORAL_TABLET | Freq: Every day | ORAL | Status: DC
  Administered 2019-09-07 – 2019-09-09 (×3): 100 mg via ORAL
  Filled 2019-09-06 (×5): qty 1

## 2019-09-06 MED ORDER — LORAZEPAM 1 MG PO TABS
1.0000 mg | ORAL_TABLET | Freq: Four times a day (QID) | ORAL | Status: DC | PRN
  Filled 2019-09-06: qty 1

## 2019-09-06 MED ORDER — LORAZEPAM 1 MG PO TABS: 0.0000 mg | ORAL_TABLET | Freq: Two times a day (BID) | ORAL | Status: DC

## 2019-09-06 MED ORDER — TRAZODONE HCL 50 MG PO TABS
50.0000 mg | ORAL_TABLET | Freq: Every evening | ORAL | Status: DC | PRN
  Filled 2019-09-06: qty 1

## 2019-09-06 MED ORDER — FOLIC ACID 1 MG PO TABS
1.0000 mg | ORAL_TABLET | Freq: Every day | ORAL | Status: DC
  Administered 2019-09-06: 1 mg via ORAL
  Filled 2019-09-06: qty 1

## 2019-09-06 MED ORDER — THIAMINE HCL 100 MG PO TABS
100.0000 mg | ORAL_TABLET | Freq: Every day | ORAL | Status: DC
  Administered 2019-09-06: 100 mg via ORAL
  Filled 2019-09-06: qty 1

## 2019-09-06 MED ORDER — THIAMINE HCL 100 MG/ML IJ SOLN: 100.0000 mg | Freq: Every day | INTRAMUSCULAR | Status: DC

## 2019-09-06 MED ORDER — LORAZEPAM 1 MG PO TABS: 0.0000 mg | ORAL_TABLET | Freq: Four times a day (QID) | ORAL | Status: DC

## 2019-09-06 NOTE — Tx Team (Signed)
Initial Treatment Plan 09/06/2019 4:16 PM [address] [name] DXI:[id number]    PATIENT STRESSORS: Substance abuse Other: Panic attacks   PATIENT STRENGTHS: Ability for insight Capable of independent living   PATIENT IDENTIFIED PROBLEMS: "Anxiety"   "Panic attacks"                   DISCHARGE CRITERIA:  Ability to meet basic life and health needs Adequate post-discharge living arrangements Improved stabilization in mood, thinking, and/or behavior  PRELIMINARY DISCHARGE PLAN: Attend aftercare/continuing care group Attend PHP/IOP  PATIENT/FAMILY INVOLVEMENT: This treatment plan has been presented to and reviewed with the patient, Mark Proctor, and/or family member.  The patient and family have been given the opportunity to ask questions and make suggestions.  [name], RN 09/06/2019, 4:16 PM

## 2019-09-06 NOTE — Progress Notes (Signed)
Pt accepted to [facility], Room 302-02 to the service of MD Cobos.  Report may be called to [phone] when transportation has been arranged.
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
## 2019-09-06 NOTE — Progress Notes (Signed)
09/06/2019  1358  Called report to [facility] [phone]. Report given to [facility].

## 2019-09-06 NOTE — Progress Notes (Signed)
   09/06/19 2234  Psych Admission Type (Psych Patients Only)  Admission Status Involuntary  Psychosocial Assessment  Patient Complaints None  Eye Contact Fair  Facial Expression Flat  Affect Appropriate to circumstance  Speech Logical/coherent  Interaction Assertive  Motor Activity Other (Comment) (WDL)  Behavior Characteristics Appropriate to situation  Mood Pleasant  Thought Process  Coherency WDL  Content WDL  Delusions None reported or observed  Perception WDL  Hallucination None reported or observed  Judgment Impaired  Confusion None  Danger to Self  Current suicidal ideation? Denies  Danger to Others  Danger to Others None reported or observed
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
## 2019-09-06 NOTE — Progress Notes (Signed)
   09/06/19 2232  COVID-19 Daily Checkoff  Have you had a fever (temp > 37.80C/100F)  in the past 24 hours?  No  If you have had runny nose, nasal congestion, sneezing in the past 24 hours, has it worsened? No  COVID-19 EXPOSURE  Have you traveled outside the state in the past 14 days? No  Have you been in contact with someone with a confirmed diagnosis of COVID-19 or PUI in the past 14 days without wearing appropriate PPE? No  Have you been living in the same home as a person with confirmed diagnosis of COVID-19 or a PUI (household contact)? No  Have you been diagnosed with COVID-19? No
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
## 2019-09-06 NOTE — ED Notes (Signed)
Uneventful shift for patient, no withdrawal symptoms noted, placement pending.

## 2019-09-07 LAB — TSH: TSH: 5.734 u[IU]/mL — ABNORMAL HIGH (ref 0.350–4.500)

## 2019-09-07 LAB — HEMOGLOBIN A1C
Hgb A1c MFr Bld: 5.2 % (ref 4.8–5.6)
Mean Plasma Glucose: 102.54 mg/dL

## 2019-09-07 LAB — T4, FREE: Free T4: 0.83 ng/dL (ref 0.61–1.12)

## 2019-09-07 NOTE — [facility] Group Notes (Signed)
Adult Psychoeducational Group Note  Date:  09/07/2019 Time:  12:54 PM  Group Topic/Focus: PROGRESSIVE RELAXATION. A group where deep breathing is taught and tensing and relaxation muscle groups is used. Imagery is used as well.  Pts are asked to imagine 3 pillars that hold them up when they are not able to hold themselves up.  :  Participation Level:  Active  Participation Quality:  Appropriate  Affect:  Blunted  Cognitive:  Oriented  Insight: Improving  Engagement in Group:  Engaged  Modes of Intervention:  Discussion and Role-play  Additional Comments:  Pt shared what holds him up is his family, friends, co workers.  Dione Housekeeper 09/07/2019, 12:54 PM

## 2019-09-07 NOTE — [facility] Group Notes (Signed)
LCSW Group Therapy 09/06/19 1:15 pm   Type of Therapy and Topic:  Group Therapy:  Setting Goals   Participation Level:  Active   Description of Group: In this process group, patients discussed using strengths to work toward goals and address challenges.  Patients identified two positive things about themselves and one goal they were working on.  Patients were given the opportunity to share openly and support each other's plan for self-empowerment.  The group discussed the value of gratitude and were encouraged to have a daily reflection of positive characteristics or circumstances.  Patients were encouraged to identify a plan to utilize their strengths to work on current challenges and goals.   Therapeutic Goals 1. Patient will verbalize personal strengths/positive qualities and relate how these can assist with achieving desired personal goals 2. Patients will verbalize affirmation of peers plans for personal change and goal setting 3. Patients will explore the value of gratitude and positive focus as related to successful achievement of goals 4. Patients will verbalize a plan for regular reinforcement of personal positive qualities and circumstances.   Summary of Patient Progress: Patient identified the definition of goals. Patients was given the opportunity to share openly and support other group members' plan for self-empowerment. Patient verbalized personal strength and how they relate to achieving the desired goal. Patient was able to identify positive goals to work towards when she returns home.        Therapeutic Modalities Cognitive Behavioral Therapy Motivational Interviewing      
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
## 2019-09-07 NOTE — Progress Notes (Signed)
   09/07/19 2334  Psych Admission Type (Psych Patients Only)  Admission Status Involuntary  Psychosocial Assessment  Patient Complaints None  Eye Contact Fair  Facial Expression Other (Comment) (Smiling, Pleasant)  Affect Appropriate to circumstance  Speech Logical/coherent  Interaction Assertive  Motor Activity Other (Comment) (WDL)  Appearance/Hygiene Unremarkable  Behavior Characteristics Appropriate to situation  Mood Pleasant  Thought Process  Coherency WDL  Content WDL  Delusions None reported or observed  Perception WDL  Hallucination None reported or observed  Judgment Impaired  Confusion None  Danger to Self  Current suicidal ideation? Denies  Danger to Others  Danger to Others None reported or observed

## 2019-09-07 NOTE — [facility] Suicide Risk Assessment (Signed)
[facility] INPATIENT:  Family/Significant Other Suicide Prevention Education  Suicide Prevention Education:  Patient Refusal for Family/Significant Other Suicide Prevention Education: The patient Mark Proctor has refused to provide written consent for family/significant other to be provided Family/Significant Other Suicide Prevention Education during admission and/or prior to discharge.  Physician notified.  Suicide Prevention Education was reviewed thoroughly with patient, including risk factors, warning signs, and what to do.  Mobile Crisis services were described and that telephone number pointed out, with encouragement to patient to put this number in personal cell phone.  Brochure was provided to patient to share with natural supports.  Patient acknowledged the ways in which they are at risk, and how working through each of their issues can gradually start to reduce their risk factors.  Patient was encouraged to think of the information in the context of people in their own lives.  Patient denied having access to firearms  Patient verbalized understanding of information provided.  Patient endorsed a desire to live.      Carloyn Jaeger Grossman-Orr 09/07/2019, 10:16 AM
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
## 2019-09-07 NOTE — Progress Notes (Signed)
   09/07/19 1000  Psych Admission Type (Psych Patients Only)  Admission Status Involuntary  Psychosocial Assessment  Patient Complaints None  Eye Contact Fair  Facial Expression Flat  Affect Appropriate to circumstance  Speech Logical/coherent  Interaction Assertive  Motor Activity Other (Comment) (WDL)  Appearance/Hygiene Unremarkable  Behavior Characteristics Cooperative;Appropriate to situation  Mood Pleasant  Thought Process  Coherency WDL  Content WDL  Delusions None reported or observed  Perception WDL  Hallucination None reported or observed  Judgment Impaired  Confusion None  Danger to Self  Current suicidal ideation? Denies  Danger to Others  Danger to Others None reported or observed

## 2019-09-08 NOTE — [facility] Group Notes (Signed)
Occupational Therapy Group Note Date: 09/08/2019 Group Topic/Focus: Stress Management  Group Description: Patients engaged collaboratively in discussion focused on stress management, specifically identifying physical signs, emotional signs, negative strategies, and positive strategies in which we manage our stress. Patients shared personal examples and situations in which they have endured stress and how they have managed in the past. Patients engaged collaboratively by having ideas written out in a chart on the white board and were provided further education/resources via a stress management handout.  Participation Level: Moderate   Participation Quality: Minimal Cues   Behavior: Calm, Cooperative and Shy   Speech/Thought Process: Coherent and Focused   Affect/Mood: Anxious   Insight: Moderate   Judgement: Moderate   Individualization: Mark Proctor was able to remain engaged for duration and intermittently offered input into discussion. Appeared anxious and shy at times, however appeared more at ease/comfortable as discussion/group progressed.  Modes of Intervention: Discussion, Education and Socialization  Patient Response to Interventions:  Attentive, Engaged, Receptive and Interested   Plan: Continue to engage patient in OT groups 2 - 3x/week.   Donne Hazel, MOT, OTR/L
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
## 2019-09-08 NOTE — Progress Notes (Signed)
The patient rated his day as a 10 out of a possible 10 since he socialized with his peers. His goal for tomorrow is to get discharged.

## 2019-09-08 NOTE — Tx Team (Cosign Needed)
Interdisciplinary Treatment and Diagnostic Plan Update  09/08/2019 Time of Session: 9:25am Mark Proctor MRN: [id number]  Principal Diagnosis: <principal problem not specified>  Secondary Diagnoses: Active Problems:   Alcohol dependence with alcohol-induced mood disorder (HCC)   Current Medications:  Current Facility-Administered Medications  Medication Dose Route Frequency Provider Last Rate Last Admin  . escitalopram (LEXAPRO) tablet 10 mg  10 mg Oral Daily Cobos, Myer Peer, MD   10 mg at 09/08/19 0812  . hydrOXYzine (ATARAX/VISTARIL) tablet 25 mg  25 mg Oral Q6H PRN Cobos, Myer Peer, MD   25 mg at 09/06/19 2148  . LORazepam (ATIVAN) tablet 1 mg  1 mg Oral Q6H PRN Cobos, Myer Peer, MD      . multivitamin with minerals tablet 1 tablet  1 tablet Oral Daily Cobos, Myer Peer, MD   1 tablet at 09/08/19 [phone]  . thiamine (B-1) injection 100 mg  100 mg Intramuscular Once Cobos, Fernando A, MD      . thiamine tablet 100 mg  100 mg Oral Daily Cobos, Myer Peer, MD   100 mg at 09/08/19 0812  . traZODone (DESYREL) tablet 50 mg  50 mg Oral QHS PRN Armando Reichert, MD       PTA Medications: Medications Prior to Admission  Medication Sig Dispense Refill Last Dose  . ASHWAGANDHA PO Take 2 capsules by mouth daily.     [name] escitalopram (LEXAPRO) 10 MG tablet Take 1 tablet (10 mg total) by mouth at bedtime. 90 tablet 1   . Multiple Vitamin (MULTIVITAMIN WITH MINERALS) TABS tablet Take 1 tablet by mouth daily.     [name] tetrahydrozoline-zinc (VISINE-AC) 0.05-0.25 % ophthalmic solution Place 2 drops into both eyes daily as needed.       Patient Stressors: Substance abuse Other: Panic attacks  Patient Strengths: Ability for insight Capable of independent living  Treatment Modalities: Medication Management, Group therapy, Case management,  1 to 1 session with clinician, Psychoeducation, Recreational therapy.   Physician Treatment Plan for Primary Diagnosis: <principal problem not  specified> Long Term Goal(s): Improvement in symptoms so as ready for discharge Improvement in symptoms so as ready for discharge   Short Term Goals: Ability to identify changes in lifestyle to reduce recurrence of condition will improve Ability to verbalize feelings will improve Ability to disclose and discuss suicidal ideas Ability to demonstrate self-control will improve Ability to identify and develop effective coping behaviors will improve Ability to maintain clinical measurements within normal limits will improve Compliance with prescribed medications will improve Ability to identify triggers associated with substance abuse/mental health issues will improve Ability to identify changes in lifestyle to reduce recurrence of condition will improve Ability to verbalize feelings will improve Ability to disclose and discuss suicidal ideas Ability to demonstrate self-control will improve Ability to identify and develop effective coping behaviors will improve Ability to maintain clinical measurements within normal limits will improve Compliance with prescribed medications will improve Ability to identify triggers associated with substance abuse/mental health issues will improve  Medication Management: Evaluate patient's response, side effects, and tolerance of medication regimen.  Therapeutic Interventions: 1 to 1 sessions, Unit Group sessions and Medication administration.  Evaluation of Outcomes: Not Met  Physician Treatment Plan for Secondary Diagnosis: Active Problems:   Alcohol dependence with alcohol-induced mood disorder ([place])  Long Term Goal(s): Improvement in symptoms so as ready for discharge Improvement in symptoms so as ready for discharge   Short Term Goals: Ability to identify changes in lifestyle to reduce recurrence of condition  will improve Ability to verbalize feelings will improve Ability to disclose and discuss suicidal ideas Ability to demonstrate self-control  will improve Ability to identify and develop effective coping behaviors will improve Ability to maintain clinical measurements within normal limits will improve Compliance with prescribed medications will improve Ability to identify triggers associated with substance abuse/mental health issues will improve Ability to identify changes in lifestyle to reduce recurrence of condition will improve Ability to verbalize feelings will improve Ability to disclose and discuss suicidal ideas Ability to demonstrate self-control will improve Ability to identify and develop effective coping behaviors will improve Ability to maintain clinical measurements within normal limits will improve Compliance with prescribed medications will improve Ability to identify triggers associated with substance abuse/mental health issues will improve     Medication Management: Evaluate patient's response, side effects, and tolerance of medication regimen.  Therapeutic Interventions: 1 to 1 sessions, Unit Group sessions and Medication administration.  Evaluation of Outcomes: Not Met   RN Treatment Plan for Primary Diagnosis: <principal problem not specified> Long Term Goal(s): Knowledge of disease and therapeutic regimen to maintain health will improve  Short Term Goals: Ability to remain free from injury will improve, Ability to disclose and discuss suicidal ideas, Ability to identify and develop effective coping behaviors will improve and Compliance with prescribed medications will improve  Medication Management: RN will administer medications as ordered by provider, will assess and evaluate patient's response and provide education to patient for prescribed medication. RN will report any adverse and/or side effects to prescribing provider.  Therapeutic Interventions: 1 on 1 counseling sessions, Psychoeducation, Medication administration, Evaluate responses to treatment, Monitor vital signs and CBGs as ordered,  Perform/monitor CIWA, COWS, AIMS and Fall Risk screenings as ordered, Perform wound care treatments as ordered.  Evaluation of Outcomes: Not Met   LCSW Treatment Plan for Primary Diagnosis: <principal problem not specified> Long Term Goal(s): Safe transition to appropriate next level of care at discharge, Engage patient in therapeutic group addressing interpersonal concerns.  Short Term Goals: Engage patient in aftercare planning with referrals and resources, Increase social support, Identify triggers associated with mental health/substance abuse issues and Increase skills for wellness and recovery  Therapeutic Interventions: Assess for all discharge needs, 1 to 1 time with Social worker, Explore available resources and support systems, Assess for adequacy in community support network, Educate family and significant other(s) on suicide prevention, Complete Psychosocial Assessment, Interpersonal group therapy.  Evaluation of Outcomes: Not Met   Progress in Treatment: Attending groups: Yes. Participating in groups: Yes. Taking medication as prescribed: Yes. Toleration medication: Yes. Family/Significant other contact made: No, will contact:  patient declined consents. Patient understands diagnosis: Yes. Discussing patient identified problems/goals with staff: Yes. Medical problems stabilized or resolved: Yes. Denies suicidal/homicidal ideation: Yes. Issues/concerns per patient self-inventory: No.  New problem(s) identified: No, Describe:  CSW will assess.  New Short Term/Long Term Goal(s):  Patient Goals:  Patient did not attend.  Discharge Plan or Barriers:   Reason for Continuation of Hospitalization: Anxiety Medication stabilization Suicidal ideation  Estimated Length of Stay: 3-5 days   Attendees: Patient: 09/08/2019   Physician:  09/08/2019   Nursing:  09/08/2019  RN Care Manager: 09/08/2019   Social Worker: Darletta Moll, [place] 09/08/2019   Recreational Therapist:   09/08/2019   Other:  09/08/2019   Other:  09/08/2019   Other: 09/08/2019     Scribe for Treatment Team: Vassie Moselle, [place] 09/08/2019 10:28 AM

## 2019-09-08 NOTE — Progress Notes (Signed)
   09/08/19 0624  Vital Signs  Temp 98.2 F (36.8 C)  Temp Source Oral  Pulse Rate (!) 53  BP 120/74  BP Location Right Arm  BP Method Automatic  Patient Position (if appropriate) Sitting   D: Patient denies SI/HI/AVH. Patient reported that he slept "good"  Patient isolated in his room. A:  Patient took scheduled medicine.  Support and encouragement provided Routine safety checks conducted every 15 minutes. Patient  Informed to notify staff with any concerns.   R:Safety maintained.

## 2019-09-08 NOTE — Progress Notes (Signed)
Recreation Therapy Notes  Date: 7.12.21 Time: 0930 Location: [address] Dayroom  Group Topic: Stress Management  Goal Area(s) Addresses:  Patient will identify positive stress management techniques. Patient will identify benefits of using stress management post d/c.    Intervention: Stress Management  Activity:  Meditation.  LRT played a meditation that focused on being resilient in the face of change.  Patients were to listen and follow along as meditation played to engage in activity.    Education:  Stress Management, Discharge Planning.   Education Outcome: Acknowledges Education  Clinical Observations/Feedback: Pt did not attend group activity.    Zenaya Ulatowski, LRT/CTRS         Telesforo Brosnahan A 09/08/2019 10:47 AM 

## 2019-09-09 DIAGNOSIS — F1020 Alcohol dependence, uncomplicated: Secondary | ICD-10-CM

## 2019-09-09 DIAGNOSIS — F332 Major depressive disorder, recurrent severe without psychotic features: Principal | ICD-10-CM

## 2019-09-09 LAB — T3, FREE: T3, Free: 3 pg/mL (ref 2.0–4.4)

## 2019-09-09 MED ORDER — ESCITALOPRAM OXALATE 10 MG PO TABS: 10.0000 mg | ORAL_TABLET | Freq: Every day | ORAL | 0 refills | Status: DC

## 2019-09-09 NOTE — Progress Notes (Signed)
  [facility] Adult Case Management Discharge Plan :  Will you be returning to the same living situation after discharge:  Yes,  to home. At discharge, do you have transportation home?: Yes,  brother to pick this patient up. Do you have the ability to pay for your medications: Yes,  has insurance.  Release of information consent forms completed and in the chart;  Patient's signature needed at discharge.  Patient to Follow up at:  Follow-up Information    [facility]. Go on 09/17/2019.   Specialty: Family Medicine Why: You have a hospital discharge appointment with this provider on 09/17/19 at 2:10 pm with Dr. Randa Evens.  This appointment will be held in person.  Contact information: [address] [place] [phone]       [facility]. Go on 09/16/2019.   Specialty: Behavioral Health Why: You have an appointment for therapy on 09/16/19 at 8:00 am, in person.  You also have an in person appointment for medication management on 09/30/19 at 1:30 pm. This agency has Peer Support Specialists available to talk to you on a drop-in basis. Contact information: [phone] [place] [address] [phone]              Next level of care provider has access to [facility] Link:yes  Safety Planning and Suicide Prevention discussed: Yes,  with patient.   Have you used any form of tobacco in the last 30 days? (Cigarettes, Smokeless Tobacco, Cigars, and/or Pipes): Yes  Has patient been referred to the Quitline?: Patient refused referral  Patient has been referred for addiction treatment: Yes  Otelia Santee, LCSWA 09/09/2019, 11:39 AM

## 2019-09-09 NOTE — BHH Suicide Risk Assessment (Signed)
[facility] Discharge Suicide Risk Assessment   Principal Problem: <principal problem not specified> Discharge Diagnoses: Active Problems:   Alcohol dependence with alcohol-induced mood disorder (HCC)   Total Time spent with patient: 15 minutes  Musculoskeletal: Strength & Muscle Tone: within normal limits Gait & Station: normal Patient leans: N/A  Psychiatric Specialty Exam: Review of Systems  All other systems reviewed and are negative.   Blood pressure 124/83, pulse 64, temperature 97.6 F (36.4 C), temperature source Oral, resp. rate 18, height 5\' 8"  (1.727 m), weight 112.9 kg, SpO2 100 %.Body mass index is 37.86 kg/m.  General Appearance: Casual  Eye Contact::  Good  Speech:  Normal Rate409  Volume:  Normal  Mood:  Euthymic  Affect:  Congruent  Thought Process:  Coherent and Descriptions of Associations: Intact  Orientation:  Full (Time, Place, and Person)  Thought Content:  Logical  Suicidal Thoughts:  No  Homicidal Thoughts:  No  Memory:  Immediate;   Good Recent;   Good Remote;   Good  Judgement:  Intact  Insight:  Fair  Psychomotor Activity:  Normal  Concentration:  Good  Recall:  Good  Fund of Knowledge:Good  Language: Good  Akathisia:  Negative  Handed:  Right  AIMS (if indicated):     Assets:  Desire for Improvement Housing Resilience  Sleep:  Number of Hours: 6.75  Cognition: WNL  ADL's:  Intact   Mental Status Per Nursing Assessment::   On Admission:  Suicidal ideation indicated by others, Self-harm behaviors  Demographic Factors:  Male  Loss Factors: Financial problems/change in socioeconomic status  Historical Factors: Impulsivity  Risk Reduction Factors:   Living with another person, especially a relative  Continued Clinical Symptoms:  Depression:   Comorbid alcohol abuse/dependence Impulsivity Alcohol/Substance Abuse/Dependencies  Cognitive Features That Contribute To Risk:  None    Suicide Risk:  Minimal: No identifiable suicidal  ideation.  Patients presenting with no risk factors but with morbid ruminations; may be classified as minimal risk based on the severity of the depressive symptoms   Follow-up Information    [facility]. Go on 09/17/2019.   Specialty: Family Medicine Why: You have a hospital discharge appointment with this provider on 09/17/19 at 2:10 pm with Dr. 09/19/19.  This appointment will be held in person.  Contact information: Randa Evens [place] [phone]       [facility]. Go on 09/16/2019.   Specialty: Behavioral Health Why: You have an appointment for therapy on 09/16/19 at 8:00 am, in person.  You also have an in person appointment for medication management on 09/30/19 at 1:30 pm. This agency has Peer Support Specialists available to talk to you on a drop-in basis. Contact information: [phone] [place] [id number]              Plan Of Care/Follow-up recommendations:  Activity:  ad lib  [phone], MD 09/09/2019, 9:50 AM

## 2019-09-09 NOTE — Progress Notes (Signed)
Pt repots his anxiety a 1. He shares that he was drinking more than 5 drinks in a day and that during the past 2 weeks he had been taking his lexapro "on and off." His goal is to stay sober which he plans on doing by not going out with his friends because that's when he tends to drink a lot. He denies SI/HI and AVH. Active listening, reassurance, and support provided. Q 15 min safety checks continue. Safety has been maintained.     09/08/19 2100  Psych Admission Type (Psych Patients Only)  Admission Status Involuntary  Psychosocial Assessment  Patient Complaints None  Eye Contact Fair  Facial Expression Other (Comment) (appropriate, pleasant)  Affect Appropriate to circumstance  Speech Logical/coherent  Interaction Assertive  Motor Activity Other (Comment) (WDL)  Appearance/Hygiene Unremarkable  Behavior Characteristics Cooperative;Appropriate to situation;Calm  Mood Pleasant  Thought Process  Coherency WDL  Content WDL  Delusions None reported or observed  Perception WDL  Hallucination None reported or observed  Judgment Impaired  Confusion None  Danger to Self  Current suicidal ideation? Denies  Danger to Others  Danger to Others None reported or observed

## 2019-09-09 NOTE — Progress Notes (Signed)
RN met with pt and reviewed pt's discharge instructions.  Pt verbalized understanding of discharge instructions and pt did not have any questions. RN reviewed and provided pt with a copy of SRA, AVS and Transition Record.  RN returned pt's belongings to pt.  Medication samples were given to pt.  Pt denied SI/HI/AVH and voiced no concerns.  Pt was appreciative of the care pt received at [facility].  Patient discharged to the lobby without incident where friend was waiting to take pt home.

## 2019-09-09 NOTE — Progress Notes (Signed)
   09/08/19 2100  COVID-19 Daily Checkoff  Have you had a fever (temp > 37.80C/100F)  in the past 24 hours?  No  COVID-19 EXPOSURE  Have you traveled outside the state in the past 14 days? No  Have you been in contact with someone with a confirmed diagnosis of COVID-19 or PUI in the past 14 days without wearing appropriate PPE? No  Have you been living in the same home as a person with confirmed diagnosis of COVID-19 or a PUI (household contact)? No  Have you been diagnosed with COVID-19? No
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
## 2019-09-16 ENCOUNTER — Ambulatory Visit (HOSPITAL_COMMUNITY): Payer: PRIVATE HEALTH INSURANCE | Admitting: Licensed Clinical Social Worker

## 2019-09-17 ENCOUNTER — Inpatient Hospital Stay (INDEPENDENT_AMBULATORY_CARE_PROVIDER_SITE_OTHER): Payer: PRIVATE HEALTH INSURANCE | Admitting: Primary Care

## 2019-09-30 ENCOUNTER — Encounter (HOSPITAL_COMMUNITY): Payer: PRIVATE HEALTH INSURANCE | Admitting: Psychiatry

## 2020-08-31 ENCOUNTER — Encounter (INDEPENDENT_AMBULATORY_CARE_PROVIDER_SITE_OTHER): Payer: Self-pay | Admitting: Primary Care

## 2020-09-14 ENCOUNTER — Encounter (INDEPENDENT_AMBULATORY_CARE_PROVIDER_SITE_OTHER): Payer: Self-pay | Admitting: Primary Care

## 2020-09-14 ENCOUNTER — Other Ambulatory Visit: Payer: Self-pay

## 2020-09-14 ENCOUNTER — Ambulatory Visit (INDEPENDENT_AMBULATORY_CARE_PROVIDER_SITE_OTHER): Payer: 59 | Admitting: Primary Care

## 2020-09-14 VITALS — BP 121/74 | HR 57 | Temp 97.3°F | Ht 66.0 in | Wt 247.2 lb

## 2020-09-14 DIAGNOSIS — E6609 Other obesity due to excess calories: Secondary | ICD-10-CM

## 2020-09-14 DIAGNOSIS — F32A Depression, unspecified: Secondary | ICD-10-CM

## 2020-09-14 DIAGNOSIS — Z6839 Body mass index (BMI) 39.0-39.9, adult: Secondary | ICD-10-CM

## 2020-09-14 DIAGNOSIS — Z0000 Encounter for general adult medical examination without abnormal findings: Secondary | ICD-10-CM

## 2020-09-14 DIAGNOSIS — F419 Anxiety disorder, unspecified: Secondary | ICD-10-CM

## 2020-09-14 NOTE — Addendum Note (Signed)
Addended by: Gwinda Passe on: 09/14/2020 10:42 AM   Modules accepted: Orders

## 2020-09-14 NOTE — Patient Instructions (Signed)
HPV Vaccine Information for Parents  HPV (human papillomavirus) is a common virus that spreads easily from person to person through skin-to-skin or sexual contact. There are many types of HPV viruses. They can cause warts in the genitals (genital or mucosal HPV), or on the hands or feet (cutaneous or nonmucosal HPV). Some genital HPV types are considered high-risk and may cause cancer. Your child can get a vaccination to help prevent certain HPV infections that can cause cancer as well as those types that cause genital and anal warts. The vaccine is safe and effective. It is recommended for boys and girls at about 11-12 years of age. Getting the vaccine at this age (before he or she is sexually active) gives your child the best protection from HPV infectionthrough adulthood. How can HPV affect my child? HPV infection can cause: Genital warts. Mouth or throat cancer. Anal cancer. Cervical, vulvar, or vaginal cancer. Penile cancer. During pregnancy, HPV infection can be passed to the baby. This infection cancause warts to develop in a baby's throat and windpipe. What actions can I take to lower my child's risk for HPV? To lower your child's risk for genital HPV infection, have him or her get the HPV vaccine before becoming sexually active. The best time for vaccination is between ages 11 and 12, though it can be given to children as young as 9 years old. If your child gets the first dose before age 15, the vaccination can be given as 2 shots, 6-12 months apart. In some situations, 3 doses are needed. If your child starts the vaccine before age 15 but does not have a second dose within 6-12 months after the first dose, he or she will need 3 doses to complete the vaccination. When your child has the first dose, it is important to make an appointment for the next shot and keep the appointment. Teens who are not vaccinated before age 15 will need 3 doses, within six months of the first dose. If your child  has a weak immune system, he or she may need 3 doses. Young adults can also get the vaccination, even if they are already sexually active and even if they have already been infected with HPV. The vaccination can still help prevent the types of cancer-causing HPV that a person has notbeen infected with. What are the risks and benefits of the HPV vaccine? Benefits The main benefit of getting vaccinated is to prevent certain cancers, including: Cervical, vulvar, and vaginal cancer in females. Penile cancer in males. Oral and anal cancer in both males and females. The risk of these cancers is lower if your child gets vaccinated before he orshe becomes sexually active. The vaccine also prevents genital warts caused by HPV. Risks The risks, although low, include side effects or reactions to the vaccine. Very few reactions have been reported, but they can include: Soreness, redness, or swelling at the injection site. Dizziness or headache. Fever. Who should not get the HPV vaccine or should wait to get it? Some children should not get the HPV vaccine or should wait. Discuss the risks and benefits of the vaccine with your child's health care provider if your child: Has had a severe allergic reaction to other vaccinations. Is allergic to yeast. Has a fever. Has had a recent illness. Is pregnant or may be pregnant. Where to find more information Centers for Disease Control and Prevention: cdc.gov/hpv American Academy of Pediatrics: healthychildren.org Summary HPV (human papillomavirus) is a common virus that spreads from person   to person through skin-to-skin or sexual contact. It can spread during vaginal, anal, or oral sex. Your child can get a vaccination to prevent HPV infection and cancer. It is best to get the vaccination before becoming sexually active. The HPV vaccine can protect your child from genital warts and certain types of cancer, including cancer of the cervix, throat, mouth, vulva,  vagina, anus, and penis. The HPV vaccine is both safe and effective. The best time for boys and girls to get the vaccination is when they are between ages 11 and 12. This information is not intended to replace advice given to you by your health care provider. Make sure you discuss any questions you have with your healthcare provider. Document Revised: 10/21/2019 Document Reviewed: 09/30/2019 Elsevier Patient Education  2022 Elsevier Inc.  

## 2020-09-14 NOTE — Progress Notes (Signed)
Subjective:    Mark Proctor is a 25 y.o. male who presents for Medicare Annual/Subsequent preventive examination.   Preventive Screening-Counseling & Management  Tobacco Social History   Tobacco Use  Smoking Status Never  Smokeless Tobacco Never    Problems Prior to Visit Anxiety-with palpitation   Current Problems (verified) Patient Active Problem List   Diagnosis Date Noted   Major depressive disorder, recurrent severe without psychotic features (HCC) 09/06/2019   Alcohol dependence with alcohol-induced mood disorder (HCC)     Medications Prior to Visit Current Outpatient Medications on File Prior to Visit  Medication Sig Dispense Refill   escitalopram (LEXAPRO) 10 MG tablet Take 1 tablet (10 mg total) by mouth daily. (Patient not taking: Reported on 09/14/2020) 30 tablet 0   Multiple Vitamin (MULTIVITAMIN WITH MINERALS) TABS tablet Take 1 tablet by mouth daily.     No current facility-administered medications on file prior to visit.    Current Medications (verified) Current Outpatient Medications  Medication Sig Dispense Refill   escitalopram (LEXAPRO) 10 MG tablet Take 1 tablet (10 mg total) by mouth daily. (Patient not taking: Reported on 09/14/2020) 30 tablet 0   Multiple Vitamin (MULTIVITAMIN WITH MINERALS) TABS tablet Take 1 tablet by mouth daily.     No current facility-administered medications for this visit.     Allergies (verified) Patient has no known allergies.   PAST HISTORY  Family History No family history on file.  Social History Social History   Tobacco Use   Smoking status: Never   Smokeless tobacco: Never  Substance Use Topics   Alcohol use: Yes    Are there smokers in your home (other than you)?  No  Risk Factors Current exercise habits: Gym/ health club routine includes cardio, stair stepper , and treadmill.  Dietary issues discussed: monitoring diet    Cardiac risk factors: obesity (BMI >= 30 kg/m2).  Depression  Screen (Note: if answer to either of the following is "Yes", a more complete depression screening is indicated)   Q1: Over the past two weeks, have you felt down, depressed or hopeless? No  Q2: Over the past two weeks, have you felt little interest or pleasure in doing things? No  Have you lost interest or pleasure in daily life? No  Do you often feel hopeless? No  Do you cry easily over simple problems? No  Activities of Daily Living In your present state of health, do you have any difficulty performing the following activities?:  Driving? Yes Managing money?  Yes Feeding yourself? Yes Getting from bed to chair? Yes Climbing a flight of stairs? Yes Preparing food and eating?: Yes Bathing or showering? Yes Getting dressed: Yes Getting to the toilet? Yes Using the toilet:Yes Moving around from place to place: Yes In the past year have you fallen or had a near fall?:No   Are you sexually active?  Yes  Do you have more than one partner?  maybe  Hearing Difficulties: No  Do you feel safe at home?  Yes  Cognitive Testing  Alert? Yes  Normal Appearance?Yes  Oriented to person? Yes  Place? Yes   Time? Yes   Advanced Directives have been discussed with the patient? No   List the Names of Other Physician/Practitioners you currently use: 1.    Indicate any recent Medical Services you may have received from other than [facility] providers in the past year (date may be approximate).  Immunization History  Administered Date(s) Administered   Influenza,inj,Quad PF,6+ Mos 04/12/2017  Tdap 04/12/2017, 08/17/2018    Screening Tests Health Maintenance  Topic Date Due   COVID-19 Vaccine (1) Never done   Pneumococcal Vaccine 31-85 Years old (1 - PCV) Never done   HPV VACCINES (1 - Male 2-dose series) Never done   Hepatitis C Screening  Never done   INFLUENZA VACCINE  09/27/2020   TETANUS/TDAP  08/16/2028   HIV Screening  Completed    All answers were reviewed with the patient and  necessary referrals were made:  Grayce Sessions, NP   09/14/2020   History reviewed: allergies, current medications, past family history, past medical history, past social history, past surgical history, and problem list  Review of Systems Pertinent items noted in HPI and remainder of comprehensive ROS otherwise negative.    Objective:   Blood pressure 121/74, pulse (!) 57, temperature (!) 97.3 F (36.3 C), temperature source Temporal, height 5\' 6"  (1.676 m), weight 247 lb 3.2 oz (112.1 kg), SpO2 93 %. Body mass index is 39.9 kg/m.  BP 121/74 (BP Location: Right Arm, Patient Position: Sitting, Cuff Size: Large)   Pulse (!) 57   Temp (!) 97.3 F (36.3 C) (Temporal)   Ht 5\' 6"  (1.676 m)   Wt 247 lb 3.2 oz (112.1 kg)   SpO2 93%   BMI 39.90 kg/m   General Appearance:    Alert, cooperative, no distress, appears stated age , obese male   Head:    Normocephalic, without obvious abnormality, atraumatic  Eyes:    PERRL, conjunctiva/corneas clear, EOM's intact, fundi    benign, both eyes       Ears:    Normal TM's and external ear canals, both ears  Nose:   Nares normal, septum midline, mucosa normal, no drainage    or sinus tenderness  Throat:   Lips, mucosa, and tongue normal; teeth and gums normal  Neck:   Supple, symmetrical, trachea midline, no adenopathy;       thyroid:  No enlargement/tenderness/nodules; no carotid   bruit or JVD  Back:     Symmetric, no curvature, ROM normal, no CVA tenderness  Lungs:     Clear to auscultation bilaterally, respirations unlabored  Chest wall:    No tenderness or deformity  Heart:    Regular rate and rhythm, S1 and S2 normal, no murmur, rub   or gallop  Abdomen:     Soft, non-tender, bowel sounds active all four quadrants,    no masses, no organomegaly  Genitalia:  Defer   Rectal:  Defer  Extremities:   Extremities normal, atraumatic, no cyanosis or edema  Pulses:   2+ and symmetric all extremities  Skin:   Skin color, texture, turgor  normal, no rashes or lesions  Lymph nodes:   Cervical, supraclavicular, and axillary nodes normal  Neurologic:   Normal strength, sensation and reflexes      throughout       Assessment:  Romuald was seen today for annual exam.  Diagnoses and all orders for this visit:  Anxiety and depression Choices to exercise and learn to handle problems and stress without medication  Annual physical exam  Complete    Class 2 obesity due to excess calories without serious comorbidity with body mass index (BMI) of 39.0 to 39.9 in adult  Obesity is 30-39 indicating an excess in caloric intake or underlining conditions. This may lead to other co-morbidities. Lifestyle modifications of diet and exercise may reduce obesity.     Plan:     During the  course of the visit the patient was educated and counseled about appropriate screening and preventive services including:   Nutrition counseling   Diet review for nutrition referral? Yes ____  Not Indicated _no___   Patient Instructions (the written plan) was given to the patient.  Medicare Attestation I have personally reviewed: The patient's medical and social history Their use of alcohol, tobacco or illicit drugs Their current medications and supplements The patient's functional ability including ADLs,fall risks, home safety risks, cognitive, and hearing and visual impairment Diet and physical activities Evidence for depression or mood disorders  The patient's weight, height, BMI, and visual acuity have been recorded in the chart.  I have made referrals, counseling, and provided education to the patient based on review of the above and I have provided the patient with a written personalized care plan for preventive services.     Grayce Sessions, NP   09/14/2020

## 2020-09-15 LAB — CBC WITH DIFFERENTIAL/PLATELET
Basophils Absolute: 0 10*3/uL (ref 0.0–0.2)
Basos: 0 %
EOS (ABSOLUTE): 0.1 10*3/uL (ref 0.0–0.4)
Eos: 3 %
Hematocrit: 45.6 % (ref 37.5–51.0)
Hemoglobin: 14.9 g/dL (ref 13.0–17.7)
Immature Grans (Abs): 0 10*3/uL (ref 0.0–0.1)
Immature Granulocytes: 0 %
Lymphocytes Absolute: 2.1 10*3/uL (ref 0.7–3.1)
Lymphs: 44 %
MCH: 29.2 pg (ref 26.6–33.0)
MCHC: 32.7 g/dL (ref 31.5–35.7)
MCV: 89 fL (ref 79–97)
Monocytes Absolute: 0.5 10*3/uL (ref 0.1–0.9)
Monocytes: 10 %
Neutrophils Absolute: 2 10*3/uL (ref 1.4–7.0)
Neutrophils: 43 %
Platelets: 198 10*3/uL (ref 150–450)
RBC: 5.1 x10E6/uL (ref 4.14–5.80)
RDW: 12.8 % (ref 11.6–15.4)
WBC: 4.7 10*3/uL (ref 3.4–10.8)

## 2020-09-15 LAB — CMP14+EGFR
ALT: 29 IU/L (ref 0–44)
AST: 21 IU/L (ref 0–40)
Albumin/Globulin Ratio: 1.7 (ref 1.2–2.2)
Albumin: 4.5 g/dL (ref 4.1–5.2)
Alkaline Phosphatase: 82 IU/L (ref 44–121)
BUN/Creatinine Ratio: 12 (ref 9–20)
BUN: 13 mg/dL (ref 6–20)
Bilirubin Total: 0.5 mg/dL (ref 0.0–1.2)
CO2: 24 mmol/L (ref 20–29)
Calcium: 9.6 mg/dL (ref 8.7–10.2)
Chloride: 105 mmol/L (ref 96–106)
Creatinine, Ser: 1.06 mg/dL (ref 0.76–1.27)
Globulin, Total: 2.6 g/dL (ref 1.5–4.5)
Glucose: 90 mg/dL (ref 65–99)
Potassium: 4.2 mmol/L (ref 3.5–5.2)
Sodium: 142 mmol/L (ref 134–144)
Total Protein: 7.1 g/dL (ref 6.0–8.5)
eGFR: 100 mL/min/{1.73_m2} (ref >59)

## 2020-09-15 LAB — LIPID PANEL
Chol/HDL Ratio: 2.8 ratio (ref 0.0–5.0)
Cholesterol, Total: 131 mg/dL (ref 100–199)
HDL: 46 mg/dL (ref >39)
LDL Chol Calc (NIH): 61 mg/dL (ref 0–99)
Triglycerides: 136 mg/dL (ref 0–149)
VLDL Cholesterol Cal: 24 mg/dL (ref 5–40)
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
## 2021-10-28 ENCOUNTER — Encounter (INDEPENDENT_AMBULATORY_CARE_PROVIDER_SITE_OTHER): Payer: 59 | Admitting: Primary Care

## 2021-11-07 ENCOUNTER — Encounter (INDEPENDENT_AMBULATORY_CARE_PROVIDER_SITE_OTHER): Payer: Self-pay | Admitting: Primary Care

## 2021-11-07 ENCOUNTER — Ambulatory Visit (INDEPENDENT_AMBULATORY_CARE_PROVIDER_SITE_OTHER): Payer: 59 | Admitting: Primary Care

## 2021-11-07 VITALS — BP 138/84 | HR 65 | Resp 16 | Ht 68.0 in | Wt 236.4 lb

## 2021-11-07 DIAGNOSIS — Z23 Encounter for immunization: Secondary | ICD-10-CM

## 2021-11-07 DIAGNOSIS — Z0000 Encounter for general adult medical examination without abnormal findings: Secondary | ICD-10-CM

## 2021-11-07 NOTE — Patient Instructions (Addendum)
Calorie Counting for Weight Loss Calories are units of energy. Your body needs a certain number of calories from food to keep going throughout the day. When you eat or drink more calories than your body needs, your body stores the extra calories mostly as fat. When you eat or drink fewer calories than your body needs, your body burns fat to get the energy it needs. Calorie counting means keeping track of how many calories you eat and drink each day. Calorie counting can be helpful if you need to lose weight. If you eat fewer calories than your body needs, you should lose weight. Ask your health care provider what a healthy weight is for you. For calorie counting to work, you will need to eat the right number of calories each day to lose a healthy amount of weight per week. A dietitian can help you figure out how many calories you need in a day and will suggest ways to reach your calorie goal. A healthy amount of weight to lose each week is usually 1-2 lb (0.5-0.9 kg). This usually means that your daily calorie intake should be reduced by 500-750 calories. Eating 1,200-1,500 calories a day can help most women lose weight. Eating 1,500-1,800 calories a day can help most men lose weight. What do I need to know about calorie counting? Work with your health care provider or dietitian to determine how many calories you should get each day. To meet your daily calorie goal, you will need to: Find out how many calories are in each food that you would like to eat. Try to do this before you eat. Decide how much of the food you plan to eat. Keep a food log. Do this by writing down what you ate and how many calories it had. To successfully lose weight, it is important to balance calorie counting with a healthy lifestyle that includes regular activity. Where do I find calorie information?  The number of calories in a food can be found on a Nutrition Facts label. If a food does not have a Nutrition Facts label, try  to look up the calories online or ask your dietitian for help. Remember that calories are listed per serving. If you choose to have more than one serving of a food, you will have to multiply the calories per serving by the number of servings you plan to eat. For example, the label on a package of bread might say that a serving size is 1 slice and that there are 90 calories in a serving. If you eat 1 slice, you will have eaten 90 calories. If you eat 2 slices, you will have eaten 180 calories. How do I keep a food log? After each time that you eat, record the following in your food log as soon as possible: What you ate. Be sure to include toppings, sauces, and other extras on the food. How much you ate. This can be measured in cups, ounces, or number of items. How many calories were in each food and drink. The total number of calories in the food you ate. Keep your food log near you, such as in a pocket-sized notebook or on an app or website on your mobile phone. Some programs will calculate calories for you and show you how many calories you have left to meet your daily goal. What are some portion-control tips? Know how many calories are in a serving. This will help you know how many servings you can have of a certain   food. Use a measuring cup to measure serving sizes. You could also try weighing out portions on a kitchen scale. With time, you will be able to estimate serving sizes for some foods. Take time to put servings of different foods on your favorite plates or in your favorite bowls and cups so you know what a serving looks like. Try not to eat straight from a food's packaging, such as from a bag or box. Eating straight from the package makes it hard to see how much you are eating and can lead to overeating. Put the amount you would like to eat in a cup or on a plate to make sure you are eating the right portion. Use smaller plates, glasses, and bowls for smaller portions and to prevent  overeating. Try not to multitask. For example, avoid watching TV or using your computer while eating. If it is time to eat, sit down at a table and enjoy your food. This will help you recognize when you are full. It will also help you be more mindful of what and how much you are eating. What are tips for following this plan? Reading food labels Check the calorie count compared with the serving size. The serving size may be smaller than what you are used to eating. Check the source of the calories. Try to choose foods that are high in protein, fiber, and vitamins, and low in saturated fat, trans fat, and sodium. Shopping Read nutrition labels while you shop. This will help you make healthy decisions about which foods to buy. Pay attention to nutrition labels for low-fat or fat-free foods. These foods sometimes have the same number of calories or more calories than the full-fat versions. They also often have added sugar, starch, or salt to make up for flavor that was removed with the fat. Make a grocery list of lower-calorie foods and stick to it. Cooking Try to cook your favorite foods in a healthier way. For example, try baking instead of frying. Use low-fat dairy products. Meal planning Use more fruits and vegetables. One-half of your plate should be fruits and vegetables. Include lean proteins, such as chicken, turkey, and fish. Lifestyle Each week, aim to do one of the following: 150 minutes of moderate exercise, such as walking. 75 minutes of vigorous exercise, such as running. General information Know how many calories are in the foods you eat most often. This will help you calculate calorie counts faster. Find a way of tracking calories that works for you. Get creative. Try different apps or programs if writing down calories does not work for you. What foods should I eat?  Eat nutritious foods. It is better to have a nutritious, high-calorie food, such as an avocado, than a food with  few nutrients, such as a bag of potato chips. Use your calories on foods and drinks that will fill you up and will not leave you hungry soon after eating. Examples of foods that fill you up are nuts and nut butters, vegetables, lean proteins, and high-fiber foods such as whole grains. High-fiber foods are foods with more than 5 g of fiber per serving. Pay attention to calories in drinks. Low-calorie drinks include water and unsweetened drinks. The items listed above may not be a complete list of foods and beverages you can eat. Contact a dietitian for more information. What foods should I limit? Limit foods or drinks that are not good sources of vitamins, minerals, or protein or that are high in unhealthy fats. These   include: Candy. Other sweets. Sodas, specialty coffee drinks, alcohol, and juice. The items listed above may not be a complete list of foods and beverages you should avoid. Contact a dietitian for more information. How do I count calories when eating out? Pay attention to portions. Often, portions are much larger when eating out. Try these tips to keep portions smaller: Consider sharing a meal instead of getting your own. If you get your own meal, eat only half of it. Before you start eating, ask for a container and put half of your meal into it. When available, consider ordering smaller portions from the menu instead of full portions. Pay attention to your food and drink choices. Knowing the way food is cooked and what is included with the meal can help you eat fewer calories. If calories are listed on the menu, choose the lower-calorie options. Choose dishes that include vegetables, fruits, whole grains, low-fat dairy products, and lean proteins. Choose items that are boiled, broiled, grilled, or steamed. Avoid items that are buttered, battered, fried, or served with cream sauce. Items labeled as crispy are usually fried, unless stated otherwise. Choose water, low-fat milk,  unsweetened iced tea, or other drinks without added sugar. If you want an alcoholic beverage, choose a lower-calorie option, such as a glass of wine or light beer. Ask for dressings, sauces, and syrups on the side. These are usually high in calories, so you should limit the amount you eat. If you want a salad, choose a garden salad and ask for grilled meats. Avoid extra toppings such as bacon, cheese, or fried items. Ask for the dressing on the side, or ask for olive oil and vinegar or lemon to use as dressing. Estimate how many servings of a food you are given. Knowing serving sizes will help you be aware of how much food you are eating at restaurants. Where to find more information Centers for Disease Control and Prevention: http://www.wolf.info/ [place]. Department of Agriculture: http://www.wilson-mendoza.org/ Summary Calorie counting means keeping track of how many calories you eat and drink each day. If you eat fewer calories than your body needs, you should lose weight. A healthy amount of weight to lose per week is usually 1-2 lb (0.5-0.9 kg). This usually means reducing your daily calorie intake by 500-750 calories. The number of calories in a food can be found on a Nutrition Facts label. If a food does not have a Nutrition Facts label, try to look up the calories online or ask your dietitian for help. Use smaller plates, glasses, and bowls for smaller portions and to prevent overeating. Use your calories on foods and drinks that will fill you up and not leave you hungry shortly after a meal. This information is not intended to replace advice given to you by your health care provider. Make sure you discuss any questions you have with your health care provider. Document Revised: 03/27/2019 Document Reviewed: 03/27/2019 Elsevier Patient Education  Central. Preventing Hypertension Hypertension, also called high blood pressure, is when the force of blood pumping through the arteries is too strong. Arteries are blood  vessels that carry blood from the heart throughout the body. Often, hypertension does not cause symptoms until blood pressure is very high. It is important to have your blood pressure checked regularly. Diet and lifestyle changes can help you prevent hypertension, and they may make you feel better overall and improve your quality of life. If you already have hypertension, you may control it with diet and lifestyle changes,  as well as with medicine. How can this condition affect me? Over time, hypertension can damage the arteries and decrease blood flow to important parts of the body, including the brain, heart, and kidneys. By keeping your blood pressure in a healthy range, you can help prevent complications like heart attack, heart failure, stroke, kidney failure, and vascular dementia. What can increase my risk? An unhealthy diet and a lack of physical activity can make you more likely to develop high blood pressure. Some other risk factors include: Age. The risk increases with age. Having family members who have had high blood pressure. Having certain health conditions, such as thyroid problems. Being overweight or obese. Drinking too much alcohol or caffeine. Having too much fat, sugar, calories, or salt (sodium) in your diet. Smoking or using illegal drugs. Taking certain medicines, such as antidepressants, decongestants, birth control pills, and NSAIDs, such as ibuprofen. What actions can I take to prevent or manage this condition? Work with your health care provider to make a hypertension prevention plan that works for you. You may be referred for counseling on a healthy diet and physical activity. Follow your plan and keep all follow-up visits. Diet changes Maintain a healthy diet. This includes: Eating less salt (sodium). Ask your health care provider how much sodium is safe for you to have. The general recommendation is to have less than 1 tsp (2,300 mg) of sodium a day. Do not add salt  to your food. Choose low-sodium options when grocery shopping and eating out. Limiting fats in your diet. You can do this by eating low-fat or fat-free dairy products and by eating less red meat. Eating more fruits, vegetables, and whole grains. Make a goal to eat: 1-2 cups of fresh fruits and vegetables each day. 3-4 servings of whole grains each day. Avoiding foods and beverages that have added sugars. Eating fish that contain healthy fats (omega-3 fatty acids), such as mackerel or salmon. If you need help putting together a healthy eating plan, try the DASH diet. This diet is high in fruits, vegetables, and whole grains. It is low in sodium, red meat, and added sugars. DASH stands for Dietary Approaches to Stop Hypertension. Lifestyle changes  Lose weight if you are overweight. Losing just 3-5% of your body weight can help prevent or control hypertension. For example, if your present weight is 200 lb (91 kg), a loss of 3-5% of your weight means losing 6-10 lb (2.7-4.5 kg). Ask your health care provider to help you with a diet and exercise plan to safely lose weight. Get enough exercise. Do at least 150 minutes of moderate-intensity exercise each week. You could do this in short exercise sessions several times a day, or you could do longer exercise sessions a few times a week. For example, you could take a brisk 10-minute walk or bike ride, 3 times a day, for 5 days a week. Find ways to reduce stress, such as exercising, meditating, listening to music, or taking a yoga class. If you need help reducing stress, ask your health care provider. Do not use any products that contain nicotine or tobacco. These products include cigarettes, chewing tobacco, and vaping devices, such as e-cigarettes. Chemicals in tobacco and nicotine products raise your blood pressure each time you use them. If you need help quitting, ask your health care provider. Learn how to check your blood pressure at home. Make sure that  you know your personal target blood pressure, as told by your health care provider. Try to  sleep 7-9 hours per night. Alcohol use Do not drink alcohol if: Your health care provider tells you not to drink. You are pregnant, may be pregnant, or are planning to become pregnant. If you drink alcohol: Limit how much you have to: 0-1 drink a day for women. 0-2 drinks a day for men. Know how much alcohol is in your drink. In the [place] one drink equals one 12 oz bottle of beer (355 mL), one 5 oz glass of wine (148 mL), or one 1 oz glass of hard liquor (44 mL). Medicines In addition to diet and lifestyle changes, your health care provider may recommend medicines to help lower your blood pressure. In general: You may need to try a few different medicines to find what works best for you. You may need to take more than one medicine. Take over-the-counter and prescription medicines only as told by your health care provider. Questions to ask your health care provider What is my blood pressure goal? How can I lower my risk for high blood pressure? How should I monitor my blood pressure at home? Where to find support Your health care provider can help you prevent hypertension and help you keep your blood pressure at a healthy level. Your local hospital or your community may also provide support services and prevention programs. The American Heart Association offers an online support network at supportnetwork.heart.org Where to find more information Learn more about hypertension from: National Heart, Lung, and Blood Institute: PopSteam.is Centers for Disease Control and Prevention: FootballExhibition.com.br American Academy of Family Physicians: familydoctor.org Learn more about the DASH diet from: National Heart, Lung, and Blood Institute: PopSteam.is Contact a health care provider if: You think you are having a reaction to medicines you have taken. You have recurrent headaches or feel dizzy. You  have swelling in your ankles. You have trouble with your vision. Get help right away if: You have sudden, severe chest, back, or abdominal pain or discomfort. You have shortness of breath. You have a sudden, severe headache. These symptoms may be an emergency. Get help right away. Call 911. Do not wait to see if the symptoms will go away. Do not drive yourself to the hospital. Summary Hypertension often does not cause any symptoms until blood pressure is very high. It is important to get your blood pressure checked regularly. Diet and lifestyle changes are important steps in preventing hypertension. By keeping your blood pressure in a healthy range, you may prevent complications like heart attack, heart failure, stroke, and kidney failure. Work with your health care provider to make a hypertension prevention plan that works for you. This information is not intended to replace advice given to you by your health care provider. Make sure you discuss any questions you have with your health care provider. Document Revised: 12/02/2020 Document Reviewed: 12/02/2020 Elsevier Patient Education  2023 [employer]. Emtricitabine; Tenofovir Alafenamide Tablets What is this medication? EMTRICITABINE; TENOFOVIR ALAFENAMIDE (em trye SYE ta been; ten OF oh vir AL a FEN a mide) helps manage the symptoms of HIV infection. It may also be used for PrEP (pre-exposure prophylaxis), which lowers the risk of getting HIV through sex. It works by limiting the spread of HIV in the body. It is a combination of two antiretroviral medications. This medication is not a cure for HIV or AIDS and it may still be possible to spread HIV to others while taking it. It does not prevent other sexually transmitted infections (STIs). This medicine may be used for other purposes;  ask your health care provider or pharmacist if you have questions. COMMON BRAND NAME(S): Descovy What should I tell my care team before I take this  medication? They need to know if you have any of these conditions: Kidney disease Liver disease An unusual or allergic reaction to emtricitabine, tenofovir, other medications, foods, dyes, or preservatives Pregnant or trying to get pregnant Breast-feeding How should I use this medication? Take this medication by mouth. You can take it with or without food. If it upsets your stomach, take it with food. For your therapy to work as well as possible, take each dose exactly as prescribed on the prescription label. Do not skip doses. Skipping doses can make the HIV virus resistant to this and other medications. Keep taking this therapy unless your care team tells you to stop. A special MedGuide will be given to you by the pharmacist with each prescription and refill. Be sure to read this information carefully each time. Talk to your care team about the use of this medication in children. While it may be given to children for selected conditions, precautions do apply. Overdosage: If you think you have taken too much of this medicine contact a poison control center or emergency room at once. NOTE: This medicine is only for you. Do not share this medicine with others. What if I miss a dose? If you miss a dose, take it as soon as you can. If it is almost time for your next dose, take only that dose. Do not take double or extra doses. What may interact with this medication? Do not take this medication with any of the following: Adefovir Any medication that contains emtricitabine or tenofovir Any medication that contains lamivudine This medication may also interact with the following: Certain antibiotics like rifabutin, rifampin, rifapentine, aminoglycosides Certain medications for seizures like carbamazepine, oxcarbazepine, phenobarbital, phenytoin Medications for viral infections like cidofovir, acyclovir, valacyclovir, ganciclovir, valganciclovir Non-steroidal antiinflammatory drugs (NSAIDs) St.  John's Wort Tipranavir with ritonavir This list may not describe all possible interactions. Give your health care provider a list of all the medicines, herbs, non-prescription drugs, or dietary supplements you use. Also tell them if you smoke, drink alcohol, or use illegal drugs. Some items may interact with your medicine. What should I watch for while using this medication? Visit your care team for regular check-ups. Discuss any new symptoms with your care team. You will need to have important blood work done while on this medication. HIV is spread to others through sexual or blood contact. Talk to your care team about how to stop the spread of HIV. If you have hepatitis B, talk to your care team if you plan to stop this medication. The symptoms of hepatitis B may get worse if you stop this medication. What side effects may I notice from receiving this medication? Side effects that you should report to your care team as soon as possible: Allergic reactions--skin rash, itching, hives, swelling of the face, lips, tongue, or throat High lactic acid level--muscle pain or cramps, stomach pain, trouble breathing, general discomfort and fatigue Infection--fever, chills, cough, or sore throat Kidney injury--decrease in the amount of urine, swelling of the ankles, hands, or feet Liver injury--right upper belly pain, loss of appetite, nausea, light-colored stool, dark yellow or brown urine, yellowing skin or eyes, unusual weakness or fatigue Side effects that usually do not require medical attention (report to your care team if they continue or are bothersome): Diarrhea Fatigue Headache Nausea This list may not describe  all possible side effects. Call your doctor for medical advice about side effects. You may report side effects to FDA at 1-800-FDA-1088. Where should I keep my medication? Keep out of the reach of children. Store at room temperature between 20 and 25 degrees C (68 and 77 degrees F).  Throw away any unused medication after the expiration date. NOTE: This sheet is a summary. It may not cover all possible information. If you have questions about this medicine, talk to your doctor, pharmacist, or health care provider.  2023 Elsevier/Gold Standard (2020-05-31 00:00:00)

## 2021-11-07 NOTE — Progress Notes (Unsigned)
Mark Proctor is a 26 y.o. male bisexual male presents to office today for annual physical exam examination.   We discussed PrEP information provided on AVS.  Concerns today include: 1. None  Occupation: Barrister's clerk , Marital status: S, Substance use: None Diet: watching what he's eating , Exercise: cross fit   Health Maintenance  Topic Date Due   COVID-19 Vaccine (1) Never done   HPV VACCINES (1 - Male 2-dose series) Never done   Hepatitis C Screening  Never done   INFLUENZA VACCINE  09/27/2021   TETANUS/TDAP  08/16/2028   HIV Screening  Completed     No past medical history on file. Social History   Socioeconomic History   Marital status: Single    Spouse name: Not on file   Number of children: Not on file   Years of education: Not on file   Highest education level: Not on file  Occupational History   Not on file  Tobacco Use   Smoking status: Never   Smokeless tobacco: Never  Substance and Sexual Activity   Alcohol use: Yes   Drug use: No   Sexual activity: Not Currently  Other Topics Concern   Not on file  Social History Narrative   ** Merged History Encounter **       Social Determinants of Health   Financial Resource Strain: Not on file  Food Insecurity: Not on file  Transportation Needs: Not on file  Physical Activity: Not on file  Stress: Not on file  Social Connections: Not on file  Intimate Partner Violence: Not on file   No past surgical history on file. No family history on file.  Current Outpatient Medications:    Multiple Vitamin (MULTIVITAMIN WITH MINERALS) TABS tablet, Take 1 tablet by mouth daily. (Patient not taking: Reported on 11/07/2021), Disp: , Rfl:  Outpatient Encounter Medications as of 11/07/2021  Medication Sig   Multiple Vitamin (MULTIVITAMIN WITH MINERALS) TABS tablet Take 1 tablet by mouth daily. (Patient not taking: Reported on 11/07/2021)   No facility-administered encounter  medications on file as of 11/07/2021.    No Known Allergies   ROS: Review of Systems Pertinent items noted in HPI and remainder of comprehensive ROS otherwise negative.    Physical exam BP 138/84   Pulse 65   Resp 16   Ht '5\' 8"'  (1.727 m)   Wt 236 lb 6.4 oz (107.2 kg)   SpO2 99%   BMI 35.94 kg/m  General appearance: alert, appears older than stated age, and no distress Head: Normocephalic, without obvious abnormality, atraumatic Eyes: conjunctivae/corneas clear. PERRL, EOM's intact. Fundi benign. Back: symmetric, no curvature. ROM normal. No CVA tenderness. Lungs: clear to auscultation bilaterally Heart: regular rate and rhythm, S1, S2 normal, no murmur, click, rub or gallop Abdomen: soft, non-tender; bowel sounds normal; no masses,  no organomegaly Extremities: extremities normal, atraumatic, no cyanosis or edema Skin: Skin color, texture, turgor normal. No rashes or lesions Neurologic: Alert and oriented X 3, normal strength and tone. Normal symmetric reflexes. Normal coordination and gait    Assessment/ Plan: Mark Proctor here for annual physical exam.  Mark Proctor was seen today for annual exam.  Diagnoses and all orders for this visit:  Annual physical exam -     Lipid Panel -     CMP14+EGFR -     CBC with Differential  Preventive care and health maintenance patient will receive HPV vaccination Other orders -  HPV 9-valent vaccine,Recombinant  Counseled on healthy lifestyle choices, including diet (rich in fruits, vegetables and lean meats and low in salt and simple carbohydrates) and exercise (at least 30 minutes of moderate physical activity daily).  Patient to follow up in 1 year for annual exam or sooner if needed.  The above assessment and management plan was discussed with the patient. The patient verbalized understanding of and has agreed to the management plan. Patient is aware to call the clinic if symptoms persist or worsen. Patient is aware  when to return to the clinic for a follow-up visit. Patient educated on when it is appropriate to go to the emergency department.   This note has been created with Surveyor, quantity. Any transcriptional errors are unintentional.   Kerin Perna, NP 11/07/2021, 10:29 AM

## 2021-11-08 ENCOUNTER — Encounter (INDEPENDENT_AMBULATORY_CARE_PROVIDER_SITE_OTHER): Payer: 59 | Admitting: Primary Care

## 2021-11-10 ENCOUNTER — Encounter (INDEPENDENT_AMBULATORY_CARE_PROVIDER_SITE_OTHER): Payer: Self-pay | Admitting: Primary Care

## 2021-11-10 LAB — CBC WITH DIFFERENTIAL/PLATELET

## 2021-11-10 LAB — LIPID PANEL
Chol/HDL Ratio: 3.1 ratio (ref 0.0–5.0)
Cholesterol, Total: 141 mg/dL (ref 100–199)
HDL: 46 mg/dL (ref >39)
LDL Chol Calc (NIH): 74 mg/dL (ref 0–99)
Triglycerides: 117 mg/dL (ref 0–149)
VLDL Cholesterol Cal: 21 mg/dL (ref 5–40)

## 2021-11-10 LAB — CMP14+EGFR
ALT: 23 IU/L (ref 0–44)
AST: 19 IU/L (ref 0–40)
Albumin/Globulin Ratio: 1.7 (ref 1.2–2.2)
Albumin: 4.7 g/dL (ref 4.3–5.2)
Alkaline Phosphatase: 70 IU/L (ref 44–121)
BUN/Creatinine Ratio: 9 (ref 9–20)
BUN: 10 mg/dL (ref 6–20)
Bilirubin Total: 0.5 mg/dL (ref 0.0–1.2)
CO2: 23 mmol/L (ref 20–29)
Calcium: 8.2 mg/dL — ABNORMAL LOW (ref 8.7–10.2)
Chloride: 101 mmol/L (ref 96–106)
Creatinine, Ser: 1.08 mg/dL (ref 0.76–1.27)
Globulin, Total: 2.8 g/dL (ref 1.5–4.5)
Glucose: 92 mg/dL (ref 70–99)
Potassium: 6.4 mmol/L — ABNORMAL HIGH (ref 3.5–5.2)
Sodium: 140 mmol/L (ref 134–144)
Total Protein: 7.5 g/dL (ref 6.0–8.5)
eGFR: 97 mL/min/{1.73_m2} (ref >59)

## 2021-11-14 ENCOUNTER — Encounter (INDEPENDENT_AMBULATORY_CARE_PROVIDER_SITE_OTHER): Payer: Self-pay

## 2021-12-01 ENCOUNTER — Ambulatory Visit (INDEPENDENT_AMBULATORY_CARE_PROVIDER_SITE_OTHER): Payer: 59 | Admitting: Primary Care

## 2021-12-01 ENCOUNTER — Telehealth (INDEPENDENT_AMBULATORY_CARE_PROVIDER_SITE_OTHER): Payer: Self-pay | Admitting: Primary Care

## 2021-12-01 NOTE — Telephone Encounter (Signed)
Pt can come to [facility] to get labs done

## 2021-12-01 NOTE — Telephone Encounter (Signed)
Pt states he was to come to the office b/c you needed more labs.  However, since labs no longer done here, can you just put in the order and he can go to [facility] at his convenience.  Pt states only reason for appt today was labs.  Is that correct. Cb  [phone]

## 2021-12-22 ENCOUNTER — Ambulatory Visit (INDEPENDENT_AMBULATORY_CARE_PROVIDER_SITE_OTHER): Payer: 59 | Admitting: Primary Care

## 2021-12-22 ENCOUNTER — Other Ambulatory Visit (INDEPENDENT_AMBULATORY_CARE_PROVIDER_SITE_OTHER): Payer: 59

## 2021-12-22 ENCOUNTER — Encounter (INDEPENDENT_AMBULATORY_CARE_PROVIDER_SITE_OTHER): Payer: Self-pay

## 2021-12-22 DIAGNOSIS — Z23 Encounter for immunization: Secondary | ICD-10-CM | POA: Diagnosis not present

## 2021-12-22 DIAGNOSIS — F1024 Alcohol dependence with alcohol-induced mood disorder: Secondary | ICD-10-CM

## 2021-12-23 LAB — CBC WITH DIFFERENTIAL/PLATELET
Basophils Absolute: 0 10*3/uL (ref 0.0–0.2)
Basos: 0 %
EOS (ABSOLUTE): 0.2 10*3/uL (ref 0.0–0.4)
Eos: 2 %
Hematocrit: 45.3 % (ref 37.5–51.0)
Hemoglobin: 15 g/dL (ref 13.0–17.7)
Immature Grans (Abs): 0 10*3/uL (ref 0.0–0.1)
Immature Granulocytes: 0 %
Lymphocytes Absolute: 2.4 10*3/uL (ref 0.7–3.1)
Lymphs: 32 %
MCH: 28.8 pg (ref 26.6–33.0)
MCHC: 33.1 g/dL (ref 31.5–35.7)
MCV: 87 fL (ref 79–97)
Monocytes Absolute: 0.6 10*3/uL (ref 0.1–0.9)
Monocytes: 8 %
Neutrophils Absolute: 4.3 10*3/uL (ref 1.4–7.0)
Neutrophils: 58 %
Platelets: 235 10*3/uL (ref 150–450)
RBC: 5.2 x10E6/uL (ref 4.14–5.80)
RDW: 12.7 % (ref 11.6–15.4)
WBC: 7.5 10*3/uL (ref 3.4–10.8)

## 2022-02-09 ENCOUNTER — Ambulatory Visit (INDEPENDENT_AMBULATORY_CARE_PROVIDER_SITE_OTHER): Payer: 59 | Admitting: Primary Care

## 2022-02-09 DIAGNOSIS — Z23 Encounter for immunization: Secondary | ICD-10-CM

## 2022-02-09 NOTE — Progress Notes (Signed)
Nurse visit for 2nd HPV vaccine

## 2022-05-30 ENCOUNTER — Encounter (INDEPENDENT_AMBULATORY_CARE_PROVIDER_SITE_OTHER): Payer: Self-pay | Admitting: Primary Care

## 2022-05-30 DIAGNOSIS — Z79899 Other long term (current) drug therapy: Secondary | ICD-10-CM

## 2022-06-03 ENCOUNTER — Other Ambulatory Visit (INDEPENDENT_AMBULATORY_CARE_PROVIDER_SITE_OTHER): Payer: Self-pay | Admitting: Primary Care

## 2022-06-22 ENCOUNTER — Ambulatory Visit (INDEPENDENT_AMBULATORY_CARE_PROVIDER_SITE_OTHER): Payer: 59 | Admitting: Pharmacist

## 2022-06-22 ENCOUNTER — Other Ambulatory Visit: Payer: Self-pay

## 2022-06-22 ENCOUNTER — Other Ambulatory Visit (HOSPITAL_COMMUNITY): Payer: Self-pay

## 2022-06-22 DIAGNOSIS — Z23 Encounter for immunization: Secondary | ICD-10-CM

## 2022-06-22 DIAGNOSIS — Z113 Encounter for screening for infections with a predominantly sexual mode of transmission: Secondary | ICD-10-CM

## 2022-06-22 DIAGNOSIS — Z2981 Encounter for HIV pre-exposure prophylaxis: Secondary | ICD-10-CM

## 2022-06-22 DIAGNOSIS — Z79899 Other long term (current) drug therapy: Secondary | ICD-10-CM

## 2022-06-22 MED ORDER — EMTRICITABINE-TENOFOVIR DF 200-300 MG PO TABS
ORAL_TABLET | ORAL | 2 refills | Status: DC
Start: 2022-06-22 — End: 2022-06-26

## 2022-06-22 MED ORDER — DESCOVY 200-25 MG PO TABS
1.0000 | ORAL_TABLET | Freq: Every day | ORAL | 0 refills | Status: AC
Start: 2022-06-22 — End: 2022-06-29

## 2022-06-22 NOTE — Progress Notes (Signed)
Medication Samples have been provided to the patient.  Drug name: Descovy     Strength: 200/25 mg    Qty: 7 tablets (1 bottles) LOT: [id number]   Exp.Date: 3/25  Dosing instructions: Take one tablet by mouth once daily.   The patient has been instructed regarding the correct time, dose, and frequency of taking this medication, including desired effects and most common side effects.   Alexee Delsanto, PharmD, CPP, BCIDP, AAHIVP Clinical Pharmacist Practitioner Infectious Diseases Clinical Pharmacist [facility]  

## 2022-06-22 NOTE — Progress Notes (Signed)
NEW REFERRAL TO CPP CLINIC    Date:  06/22/2022   HPI: Mark Proctor is a 27 y.o. male who presents to the [facility] pharmacy clinic to discuss and initiate PrEP.  Insured      Uninsured     Patient Active Problem List   Diagnosis Date Noted   Major depressive disorder, recurrent severe without psychotic features 09/06/2019   Alcohol dependence with alcohol-induced mood disorder     Patient's Medications  New Prescriptions   No medications on file  Previous Medications   No medications on file  Modified Medications   No medications on file  Discontinued Medications   MULTIPLE VITAMIN (MULTIVITAMIN WITH MINERALS) TABS TABLET    Take 1 tablet by mouth daily.    Allergies: No Known Allergies  Past Medical History: No past medical history on file.  Social History: Social History   Socioeconomic History   Marital status: Single    Spouse name: Not on file   Number of children: Not on file   Years of education: Not on file   Highest education level: Not on file  Occupational History   Not on file  Tobacco Use   Smoking status: Never   Smokeless tobacco: Never  Substance and Sexual Activity   Alcohol use: Yes   Drug use: No   Sexual activity: Not Currently  Other Topics Concern   Not on file  Social History Narrative   ** Merged History Encounter **       Social Determinants of Health   Financial Resource Strain: Not on file  Food Insecurity: Not on file  Transportation Needs: Not on file  Physical Activity: Not on file  Stress: Not on file  Social Connections: Not on file       06/22/2022    3:46 PM  [facility] HIV PREP FLOWSHEET RESULTS  Insurance Status Insured  How did you hear? PCP referral  Gender at birth Male  Gender identity cis-Male  Risk for HIV Condomless vaginal or anal intercourse  Sex Partners Men only  # sex partners past 3-6 mos 2  Sex activity preferences Receptive;Oral  Condom use No  Treated for STI? No  HIV symptoms?  None  PrEP Eligibility Yes  Paper work received? Yes    Labs:  SCr: Lab Results  Component Value Date   CREATININE 1.08 11/07/2021   CREATININE 1.06 09/14/2020   CREATININE 1.03 09/06/2019   CREATININE 1.25 (H) 09/04/2019   CREATININE 1.01 04/12/2017   HIV Lab Results  Component Value Date   HIV Non Reactive 04/12/2017   Hepatitis B No results found for: "HEPBSAB", "HEPBSAG", "HEPBCAB" Hepatitis C No results found for: "HEPCAB", "HCVRNAPCRQN" Hepatitis A No results found for: "HAV" RPR and STI No results found for: "LABRPR", "RPRTITER"      No data to display          Assessment: Mark Proctor presents to clinic today to initiate PrEP after referral from his PCP Gwinda Passe. He has been reading about PrEP and presents to discuss his options. Prefers oral therapies as compared to Apretude. He is only sexually active with males and is always the receptive partner. He/his partners do not utilize condoms, and he denies ever testing positive for an STI in the past. Denies any acute HIV symptoms today. He does not take any other prescription or OTC medications. He states he is intermittently sexually active and has only been active 3-4 times this year due to traveling for work. He asked  about the "2-1-1" method for PrEP, and we discussed studies supporting Truvada taken in this fashion as compared to daily dosing. Reviewed that I would prefer him take Truvada daily for best efficacy but given his limited sexual encounters and concern for side effects, I am agreeable to him taking the "2-1-1" method.   Counseled patient that Truvada is a PrEP regimen that can be taken with or without food to prevent HIV. Discussed the importance of taking the medication at least 2 hours (but ideally 24 hours) prior to sexual encounters to provide adequate protection from acquiring HIV. Informed him to take an additional tablet 24 hours after the first two and then one additional tablet after the 3rd  tablet. Discussed to continue daily dosing if further sexual encounters occur during these daily doses.   Also discussed how Truvada works to prevent HIV but not other STDs and encouraged the use of condoms. Counseled patient that Truvada is normally well tolerated, however some patients experience a "start up syndrome" with nausea, diarrhea, dizziness, and fatigue but that those should resolve soon after starting. Reviewed potential adverse events such as nephrotoxicity and bone density loss with Truvada. Advised that any nausea can be mitigated by taking it with food. I reviewed patient medications and found no drug interactions. Discussed how our PrEP process works here at the clinic including follow ups and lab monitoring every 3 months.  Patient is insured through [employer] and is required to fill through [employer] Will seek approval for Truvada and work with Lupita Leash as needed. Sending test script to [facility] on [address] per Lupita Leash. Provided him with Descovy samples to utilize in a "2-1-1" fashion as we wait approval for Truvada. Although this has not technically been studied, I would presume it would be effective in protecting him from acquiring HIV rather than not receiving any medication while we await Truvada approval.    Will check HIV antibody today as well as hepatitis serologies and RPR. PCP recently checked lipid profile and BMP in September 2023, so will defer those today. Denies any recent sexual activity, so he politely declines any cytologies today. He is also due for his third HPV vaccination which was administered today. Provided education about the MPX vaccine, and he will think more on this.   Plan: - Check HIV antibody, RPR, and hepatitis A/B/C serologies - Administer HPV vaccine - Provide Descovy samples - Inquire about Truvada coverage - Follow up with me on 7/25  Margarite Gouge, PharmD, CPP, BCIDP, AAHIVP Clinical Pharmacist Practitioner Infectious Diseases Clinical  Pharmacist [facility] 06/22/2022, 4:39 PM

## 2022-06-23 LAB — HEPATITIS B SURFACE ANTIGEN: Hepatitis B Surface Ag: NONREACTIVE

## 2022-06-23 LAB — HEPATITIS A ANTIBODY, TOTAL: Hepatitis A AB,Total: REACTIVE — AB

## 2022-06-23 LAB — RPR: RPR Ser Ql: NONREACTIVE

## 2022-06-23 LAB — HEPATITIS B SURFACE ANTIBODY,QUALITATIVE: Hep B S Ab: NONREACTIVE

## 2022-06-23 LAB — HEPATITIS C ANTIBODY: Hepatitis C Ab: NONREACTIVE

## 2022-06-23 LAB — HIV ANTIBODY (ROUTINE TESTING W REFLEX): HIV 1&2 Ab, 4th Generation: NONREACTIVE

## 2022-06-26 ENCOUNTER — Other Ambulatory Visit: Payer: Self-pay | Admitting: Pharmacist

## 2022-06-26 DIAGNOSIS — Z79899 Other long term (current) drug therapy: Secondary | ICD-10-CM

## 2022-06-26 MED ORDER — EMTRICITABINE-TENOFOVIR DF 200-300 MG PO TABS
ORAL_TABLET | ORAL | 2 refills | Status: DC
Start: 2022-06-26 — End: 2023-07-05

## 2022-09-21 ENCOUNTER — Ambulatory Visit (INDEPENDENT_AMBULATORY_CARE_PROVIDER_SITE_OTHER): Payer: 59 | Admitting: Pharmacist

## 2022-09-21 ENCOUNTER — Other Ambulatory Visit (HOSPITAL_COMMUNITY): Payer: Self-pay

## 2022-09-21 ENCOUNTER — Telehealth: Payer: Self-pay

## 2022-09-21 ENCOUNTER — Other Ambulatory Visit: Payer: Self-pay

## 2022-09-21 DIAGNOSIS — Z79899 Other long term (current) drug therapy: Secondary | ICD-10-CM | POA: Diagnosis not present

## 2022-09-21 MED ORDER — DESCOVY 200-25 MG PO TABS
1.0000 | ORAL_TABLET | Freq: Every day | ORAL | Status: AC
Start: 2022-09-21 — End: 2022-09-28

## 2022-09-21 NOTE — Progress Notes (Signed)
Date:  09/21/2022   HPI: Mark Proctor is a 27 y.o. male who presents to the [facility] pharmacy clinic for HIV PrEP follow-up.  Insured   [x]    Uninsured  []    Patient Active Problem List   Diagnosis Date Noted   Major depressive disorder, recurrent severe without psychotic features (HCC) 09/06/2019   Alcohol dependence with alcohol-induced mood disorder (HCC)     Patient's Medications  New Prescriptions   EMTRICITABINE-TENOFOVIR AF (DESCOVY) 200-25 MG TABLET    Take 1 tablet by mouth daily for 7 days.  Previous Medications   EMTRICITABINE-TENOFOVIR (TRUVADA) 200-300 MG TABLET    Take 2 tablets 2-24 hours before sex then 1 tablet 24 hours after initial doses and then 1 additional tablet 24 hours after second dose.  Modified Medications   No medications on file  Discontinued Medications   No medications on file    Allergies: No Known Allergies  Past Medical History: No past medical history on file.  Social History: Social History   Socioeconomic History   Marital status: Single    Spouse name: Not on file   Number of children: Not on file   Years of education: Not on file   Highest education level: Not on file  Occupational History   Not on file  Tobacco Use   Smoking status: Never   Smokeless tobacco: Never  Substance and Sexual Activity   Alcohol use: Yes   Drug use: No   Sexual activity: Not Currently  Other Topics Concern   Not on file  Social History Narrative   ** Merged History Encounter **       Social Determinants of Health   Financial Resource Strain: Not on file  Food Insecurity: Not on file  Transportation Needs: Not on file  Physical Activity: Not on file  Stress: Not on file  Social Connections: Not on file       06/22/2022    3:46 PM  [facility] HIV PREP FLOWSHEET RESULTS  Insurance Status Insured  How did you hear? PCP referral  Gender at birth Male  Gender identity cis-Male  Risk for HIV Condomless vaginal or anal intercourse   Sex Partners Men only  # sex partners past 3-6 mos 2  Sex activity preferences Receptive;Oral  Condom use No  Treated for STI? No  HIV symptoms? None  PrEP Eligibility Yes  Paper work received? Yes    Labs:  SCr: Lab Results  Component Value Date   CREATININE 1.08 11/07/2021   CREATININE 1.06 09/14/2020   CREATININE 1.03 09/06/2019   CREATININE 1.25 (H) 09/04/2019   CREATININE 1.01 04/12/2017   HIV Lab Results  Component Value Date   HIV NON-REACTIVE 06/22/2022   HIV Non Reactive 04/12/2017   Hepatitis B Lab Results  Component Value Date   HEPBSAB NON-REACTIVE 06/22/2022   HEPBSAG NON-REACTIVE 06/22/2022   Hepatitis C Lab Results  Component Value Date   HEPCAB NON-REACTIVE 06/22/2022   Hepatitis A Lab Results  Component Value Date   HAV REACTIVE (A) 06/22/2022   RPR and STI Lab Results  Component Value Date   LABRPR NON-REACTIVE 06/22/2022        No data to display          Assessment: Mark Proctor presents to clinic today for PrEP follow-up. He has no adherence or adverse event issues. Last STI screening was in April and was negative. Screened patient for acute HIV symptoms such as fatigue, muscle aches, rash, sore throat, lymphadenopathy,  headache, night sweats, nausea/vomiting/diarrhea, and fever. States he was sexually active with one new partner since his last visit and remains sexually active with other partners. He took all 7 tablets of his Descovy samples and that was all he needed. He did not fill Truvada through [facility] pharmacy because he thought the copay was $1000. Lupita Leash is adjudicating this issue with [facility] today. Will provide another 7 days of Descovy samples to bridge him until he fills Truvada. Will check HIV antibody and send in refill once it results negative. He politely declines STI testing and HBV vaccine today; will think on HBV vaccine for next time.   Plan: - Check HIV antibody - If HIV antibody, refill Truvada  x 3 months - Provide  Descovy samples x 7 days  - Follow-up with me on 10/22   Margarite Gouge, PharmD, CPP, BCIDP, AAHIVP Clinical Pharmacist Practitioner Infectious Diseases Clinical Pharmacist [facility] 09/21/2022, 4:14 PM

## 2022-09-21 NOTE — Telephone Encounter (Signed)
[facility] Patient Advocate Encounter  I called [facility] to check on copay for Truvada , I was told the Generic Truvada will be $15.00.   I was told they called the patient several times and to no reply, patient will need to call the pharmacy @ [phone] to set up his refill.   I also asked the rep will they be able to order the generic truvada in the Teva brand because I found a copay card that will make the copay $0.00.         Clearance Coots, CPhT Specialty Pharmacy Patient [facility] Phone: [phone] Fax:  [phone]

## 2022-09-22 ENCOUNTER — Encounter: Payer: Self-pay | Admitting: Pharmacist

## 2022-09-22 NOTE — Telephone Encounter (Signed)
Thank you Butch Penny

## 2022-10-06 ENCOUNTER — Emergency Department (HOSPITAL_COMMUNITY)
Admission: EM | Admit: 2022-10-06 | Discharge: 2022-10-06 | Disposition: A | Discharge status: Home / Self Care | Payer: 59 | Attending: Emergency Medicine | Admitting: Emergency Medicine

## 2022-10-06 ENCOUNTER — Other Ambulatory Visit: Payer: Self-pay

## 2022-10-06 DIAGNOSIS — Y990 Civilian activity done for income or pay: Secondary | ICD-10-CM | POA: Diagnosis not present

## 2022-10-06 DIAGNOSIS — W25XXXA Contact with sharp glass, initial encounter: Secondary | ICD-10-CM | POA: Insufficient documentation

## 2022-10-06 DIAGNOSIS — S51811A Laceration without foreign body of right forearm, initial encounter: Secondary | ICD-10-CM | POA: Diagnosis not present

## 2022-10-06 MED ORDER — LIDOCAINE HCL 2 % IJ SOLN
10.0000 mL | Freq: Once | INTRAMUSCULAR | Status: DC
  Filled 2022-10-06: qty 20

## 2022-10-06 NOTE — Discharge Instructions (Signed)
Please monitor wound and keeping it clean daily to decrease risk of infection.  You may apply over-the-counter Neosporin over the wound as needed.  Take Tylenol or ibuprofen as needed for pain.  Have your sutures removed by your doctor in 1 week.  If you develop any significant worsening symptoms do not hesitate to return for further care.

## 2022-10-06 NOTE — ED Provider Notes (Signed)
[place] EMERGENCY DEPARTMENT AT [facility] Provider Note   CSN: [id number] Arrival date & time: 10/06/22  1246     History  Chief Complaint  Patient presents with   Laceration    Mark Proctor is a 27 y.o. male.  The history is provided by the patient and medical records.  Laceration    27 yo male presents to ED today for a laceration of th right mid dorsal forearm ~3 hours ago. Patient works at [employer] and cut himself on a piece of glass while unloading a shipment. Denies foreign body currently in wound. Notes that he applied pressure to wound, but did not clean it. He came directly came to the ED. Glass was not contaminated. Denies chest pain, SOB, palpitations, dizziness, headaches, nausea, or vomiting. Last Tdap was 3 years ago. He is otherwise healthy. PMH include MDD but is currently stable. He takes Truvada for HIV prevention. Last HIV test was negative.   Home Medications Prior to Admission medications   Medication Sig Start Date End Date Taking? Authorizing Provider  emtricitabine-tenofovir (TRUVADA) 200-300 MG tablet Take 2 tablets 2-24 hours before sex then 1 tablet 24 hours after initial doses and then 1 additional tablet 24 hours after second dose. 06/26/22   Jennette Kettle, RPH-CPP      Allergies    Patient has no known allergies.    Review of Systems   Review of Systems  All other systems reviewed and are negative.   Physical Exam Updated Vital Signs BP (!) 166/104 (BP Location: Left Arm)   Pulse 75   Temp 98.6 F (37 C) (Oral)   Resp 19   Ht 5\' 8"  (1.727 m)   Wt 99.8 kg   SpO2 100%   BMI 33.45 kg/m  Physical Exam Vitals and nursing note reviewed.  Constitutional:      General: He is not in acute distress.    Appearance: He is well-developed.  HENT:     Head: Atraumatic.  Eyes:     Conjunctiva/sclera: Conjunctivae normal.  Musculoskeletal:        General: Signs of injury (Right forearm: 3 cm laceration with  subcutaneous tissue exposed noted to the dorsum of the proximal forearm without any bony muscle involvement no foreign body noted.  Sensation intact distally.) present.     Cervical back: Neck supple.  Skin:    Findings: No rash.  Neurological:     Mental Status: He is alert.     ED Results / Procedures / Treatments   Labs (all labs ordered are listed, but only abnormal results are displayed) Labs Reviewed - No data to display  EKG None  Radiology No results found.  Procedures .Marland KitchenLaceration Repair  Date/Time: 10/06/2022 2:57 PM  Performed by: Fayrene Helper, PA-C Authorized by: Fayrene Helper, PA-C   Consent:    Consent obtained:  Verbal   Consent given by:  Patient   Risks discussed:  Infection, pain and need for additional repair   Alternatives discussed:  No treatment and delayed treatment Universal protocol:    Procedure explained and questions answered to patient or proxy's satisfaction: yes     Relevant documents present and verified: yes     Patient identity confirmed:  Verbally with patient and arm band Anesthesia:    Anesthesia method:  Local infiltration   Local anesthetic:  Lidocaine 2% WITH epi Laceration details:    Location:  Shoulder/arm   Shoulder/arm location:  R lower arm  Length (cm):  3   Depth (mm):  4 Pre-procedure details:    Preparation:  Patient was prepped and draped in usual sterile fashion Exploration:    Limited defect created (wound extended): no     Hemostasis achieved with:  Direct pressure   Imaging outcome: foreign body not noted     Wound exploration: wound explored through full range of motion and entire depth of wound visualized     Wound extent: no signs of injury, no underlying fracture and no vascular damage     Contaminated: no   Treatment:    Area cleansed with:  Povidone-iodine and saline   Amount of cleaning:  Standard   Irrigation solution:  Sterile saline   Debridement:  None   Undermining:  None   Scar revision: no    Skin repair:    Repair method:  Sutures   Suture size:  4-0   Suture material:  Prolene   Suture technique:  Simple interrupted   Number of sutures:  6 Approximation:    Approximation:  Close Repair type:    Repair type:  Intermediate Post-procedure details:    Dressing:  Non-adherent dressing   Procedure completion:  Tolerated well, no immediate complications     Medications Ordered in ED Medications  lidocaine (XYLOCAINE) 2 % (with pres) injection 200 mg (has no administration in time range)    ED Course/ Medical Decision Making/ A&P                                 Medical Decision Making Risk Prescription drug management.   BP (!) 166/104 (BP Location: Left Arm)   Pulse 75   Temp 98.6 F (37 C) (Oral)   Resp 19   Ht 5\' 8"  (1.727 m)   Wt 99.8 kg   SpO2 100%   BMI 33.45 kg/m   48:24 PM 27 year old male presenting for evaluation of laceration.  Patient was at work today moving some items in a bin when he accidentally lacerated his right forearm on his dominant hand against a broken piece of glass.  He endorsed acute onset of sharp pain which is minimal at this time.  He denies any associate numbness.  He did not notice any retained foreign body.  He is up-to-date with tetanus.  He is unsure if he heparins the wound or not but decided to come here for further assessment.  No other injury.  On exam this is a well-appearing male resting comfortably in the chair appears to be in no acute discomfort.  There is a 3 cm vertical laceration noted to the proximal forearm on the dorsal aspect without any foreign body noted.  Sensation is intact distally and patient able to move his fingers without difficulty.  Laceration was successfully repaired no foreign body noted.  Patient is neurovascular intact.  Work note provided.  Wound care instruction provided.  Patient.  Have sutures removed in 1 week.  Return precautions discussed.  Image including x-ray was considered but not  performed as I have low suspicion for fracture or retained foreign body.        Final Clinical Impression(s) / ED Diagnoses Final diagnoses:  Forearm laceration, right, initial encounter    Rx / DC Orders ED Discharge Orders     None         Fayrene Helper, PA-C 10/06/22 1508    Vanetta Mulders, MD 10/06/22 1655

## 2022-10-06 NOTE — ED Triage Notes (Signed)
Laceration to right forearm, pt states he cut himself at work with a piece of glass pta.

## 2022-11-23 ENCOUNTER — Other Ambulatory Visit (HOSPITAL_COMMUNITY): Payer: Self-pay

## 2022-12-06 ENCOUNTER — Encounter (INDEPENDENT_AMBULATORY_CARE_PROVIDER_SITE_OTHER): Payer: 59 | Admitting: Primary Care

## 2022-12-07 ENCOUNTER — Ambulatory Visit (INDEPENDENT_AMBULATORY_CARE_PROVIDER_SITE_OTHER): Payer: 59 | Admitting: Primary Care

## 2022-12-07 VITALS — BP 127/81 | HR 60 | Resp 16 | Ht 68.0 in | Wt 229.4 lb

## 2022-12-07 DIAGNOSIS — Z0000 Encounter for general adult medical examination without abnormal findings: Secondary | ICD-10-CM | POA: Diagnosis not present

## 2022-12-07 DIAGNOSIS — Z6835 Body mass index (BMI) 35.0-35.9, adult: Secondary | ICD-10-CM | POA: Diagnosis not present

## 2022-12-07 DIAGNOSIS — E66812 Obesity, class 2: Secondary | ICD-10-CM

## 2022-12-07 DIAGNOSIS — E6609 Other obesity due to excess calories: Secondary | ICD-10-CM

## 2022-12-07 NOTE — Progress Notes (Signed)
[facility]  [facility] Mark Proctor is a 27 y.o. male presents to office today for annual physical exam examination.    Concerns today include: 1. no  Occupation: Pensions consultant, Marital status: S , Substance use: None Diet: monitor food choices , Exercise: Armed forces operational officer (lost 20lbs)  Health Maintenance  Topic Date Due   COVID-19 Vaccine (1) Never done   INFLUENZA VACCINE  05/28/2023 (Originally 09/28/2022)   DTaP/Tdap/Td (3 - Td or Tdap) 08/16/2028   HPV VACCINES  Completed   Hepatitis C Screening  Completed   HIV Screening  Completed     No past medical history on file. Social History   Socioeconomic History   Marital status: Single    Spouse name: Not on file   Number of children: Not on file   Years of education: Not on file   Highest education level: Some college, no degree  Occupational History   Not on file  Tobacco Use   Smoking status: Never   Smokeless tobacco: Never  Substance and Sexual Activity   Alcohol use: Yes   Drug use: No   Sexual activity: Not Currently  Other Topics Concern   Not on file  Social History Narrative   ** Merged History Encounter **       Social Determinants of Health   Financial Resource Strain: Low Risk  (12/07/2022)   Overall Financial Resource Strain (CARDIA)    Difficulty of Paying Living Expenses: Not hard at all  Food Insecurity: Food Insecurity Present (12/07/2022)   Hunger Vital Sign    Worried About Running Out of Food in the Last Year: Sometimes true    Ran Out of Food in the Last Year: Never true  Transportation Needs: No Transportation Needs (12/07/2022)   PRAPARE - Administrator, Civil Service (Medical): No    Lack of Transportation (Non-Medical): No  Physical Activity: Sufficiently Active (12/07/2022)   Exercise Vital Sign    Days of Exercise per Week: 7 days    Minutes of Exercise per Session: 60 min  Stress: No Stress Concern Present (12/07/2022)   [employer] of  Occupational Health - Occupational Stress Questionnaire    Feeling of Stress : Not at all  Social Connections: Moderately Integrated (12/07/2022)   Social Connection and Isolation Panel [NHANES]    Frequency of Communication with Friends and Family: More than three times a week    Frequency of Social Gatherings with Friends and Family: Three times a week    Attends Religious Services: More than 4 times per year    Active Member of Clubs or Organizations: Yes    Attends Banker Meetings: More than 4 times per year    Marital Status: Never married  Intimate Partner Violence: Not on file   No past surgical history on file. No family history on file.  Current Outpatient Medications:    emtricitabine-tenofovir (TRUVADA) 200-300 MG tablet, Take 2 tablets 2-24 hours before sex then 1 tablet 24 hours after initial doses and then 1 additional tablet 24 hours after second dose., Disp: 30 tablet, Rfl: 2 Outpatient Encounter Medications as of 12/07/2022  Medication Sig   emtricitabine-tenofovir (TRUVADA) 200-300 MG tablet Take 2 tablets 2-24 hours before sex then 1 tablet 24 hours after initial doses and then 1 additional tablet 24 hours after second dose.   No facility-administered encounter medications on file as of 12/07/2022.    No Known Allergies   ROS: Review of Systems Pertinent items noted  in HPI and remainder of comprehensive ROS otherwise negative.    Physical exam BP 127/81   Pulse 60   Resp 16   Ht 5\' 8"  (1.727 m)   Wt 229 lb 6.4 oz (104.1 kg)   SpO2 100%   BMI 34.88 kg/m  General appearance: alert, appears older than stated age, and no distress Head: Normocephalic, without obvious abnormality, atraumatic Eyes: conjunctivae/corneas clear. PERRL, EOM's intact. Fundi benign. Ears: normal TM's and external ear canals both ears Neck: no adenopathy, no carotid bruit, no JVD, supple, symmetrical, trachea midline, and thyroid not enlarged, symmetric, no  tenderness/mass/nodules Back: symmetric, no curvature. ROM normal. No CVA tenderness. Lungs: clear to auscultation bilaterally Heart: regular rate and rhythm, S1, S2 normal, no murmur, click, rub or gallop Abdomen: soft, non-tender; bowel sounds normal; no masses,  no organomegaly Extremities: extremities normal, atraumatic, no cyanosis or edema Pulses: 2+ and symmetric Skin: Skin color, texture, turgor normal. No rashes or lesions Lymph nodes: Cervical, supraclavicular, and axillary nodes normal. Neurologic: Alert and oriented X 3, normal strength and tone. Normal symmetric reflexes. Normal coordination and gait    Assessment/ Plan: Mark Proctor here for annual physical exam.  Mark "Kaii" was seen today for annual exam.  Diagnoses and all orders for this visit:  Annual physical exam Completed     Class 2 obesity due to excess calories without serious comorbidity with body mass index (BMI) of 35.0 to 35.9 in adult  Obesity is 30-39 indicating an excess in caloric intake or underlining conditions. This may lead to other co-morbidities. Educated on lifestyle modifications of diet and exercise which may reduce obesity.    Counseled on healthy lifestyle choices, including diet (rich in fruits, vegetables and lean meats and low in salt and simple carbohydrates) and exercise (at least 30 minutes of moderate physical activity daily).  Patient to follow up in 1 year for annual exam or sooner if needed.  The above assessment and management plan was discussed with the patient. The patient verbalized understanding of and has agreed to the management plan. Patient is aware to call the clinic if symptoms persist or worsen. Patient is aware when to return to the clinic for a follow-up visit. Patient educated on when it is appropriate to go to the emergency department.   This note has been created with Education officer, environmental. Any transcriptional  errors are unintentional.   Grayce Sessions, NP 12/07/2022, 3:23 PM

## 2022-12-18 NOTE — Progress Notes (Unsigned)
HPI: Mark Proctor is a 27 y.o. male who presents to the [facility] pharmacy clinic for HIV PrEP follow-up.  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  Modified Medications   No medications on file  Discontinued Medications   No medications on file       06/22/2022    3:46 PM  CHL HIV PREP FLOWSHEET RESULTS  Insurance Status Insured  How did you hear? PCP referral  Gender at birth Male  Gender identity cis-Male  Risk for HIV Condomless vaginal or anal intercourse  Sex Partners Men only  # sex partners past 3-6 mos 2  Sex activity preferences Receptive;Oral  Condom use No  Treated for STI? No  HIV symptoms? None  PrEP Eligibility Yes  Paper work received? Yes    Labs:  SCr: Lab Results  Component Value Date   CREATININE 1.08 11/07/2021   CREATININE 1.06 09/14/2020   CREATININE 1.03 09/06/2019   CREATININE 1.25 (H) 09/04/2019   CREATININE 1.01 04/12/2017   HIV Lab Results  Component Value Date   HIV NON-REACTIVE 09/21/2022   HIV NON-REACTIVE 06/22/2022   HIV Non Reactive 04/12/2017   Hepatitis B Lab Results  Component Value Date   HEPBSAB NON-REACTIVE 06/22/2022   HEPBSAG NON-REACTIVE 06/22/2022   Hepatitis C Lab Results  Component Value Date   HEPCAB NON-REACTIVE 06/22/2022   Hepatitis A Lab Results  Component Value Date   HAV REACTIVE (A) 06/22/2022   RPR and STI Lab Results  Component Value Date   LABRPR NON-REACTIVE 06/22/2022        No data to display           Assessment: Mark Proctor presents today for HIV PrEP follow-up. He is currently prescribed Truvada "On-demand" using 2-1-1 method. Some concerns for insurance pricing at last visit, but this should be resolved as patient was supplied copay card to pick up $15 insurance copay. Do not see that patient has filled with his pharmacy. He reports receiving from [facility] about 2 weeks ago. Screened patient for acute HIV symptoms such as fatigue, muscle aches, rash, sore throat, lymphadenopathy, headache, night sweats, nausea/vomiting/diarrhea, and fever. Obtained RPR, but Mark Proctor declines other STI testing today.  Defer immunizations: COVID/Flu and HepB, Mpox. Mark Proctor isn't feeling well today, so we will check in at next visit. Mark Proctor reports having plenty of tablets, will not need refill before next appointment. Will follow up with [place] if that changes.  Discussed importance of adherence to appropriate 2-1-1 method, including initiating first dose 2-24 hours prior to sexual contact. If sex occurs on the consecutive day after completing the 2-1-1 doses, take 1 pill per day until 48 hours after the last sexual event. If a gap of <7 days occurs between the last pill and the next sexual event, resume 1 pill daily. If a gap of >=7 days occurs between the last pill and next sexual event, start again with 2 pills. He expresses understanding of this.  Plan: - HIV  ab, CMP, RPR today - Follow up visit in 3 months on 03/28/23 with Drucie Opitz, PharmD PGY-2 Infectious Diseases Pharmacy Resident [facility] for Infectious Disease

## 2022-12-19 ENCOUNTER — Other Ambulatory Visit: Payer: Self-pay

## 2022-12-19 ENCOUNTER — Ambulatory Visit (INDEPENDENT_AMBULATORY_CARE_PROVIDER_SITE_OTHER): Payer: 59 | Admitting: Pharmacist

## 2022-12-19 ENCOUNTER — Ambulatory Visit (INDEPENDENT_AMBULATORY_CARE_PROVIDER_SITE_OTHER): Payer: 59

## 2022-12-19 DIAGNOSIS — Z23 Encounter for immunization: Secondary | ICD-10-CM

## 2022-12-19 DIAGNOSIS — Z2981 Encounter for HIV pre-exposure prophylaxis: Secondary | ICD-10-CM | POA: Diagnosis not present

## 2022-12-19 DIAGNOSIS — Z113 Encounter for screening for infections with a predominantly sexual mode of transmission: Secondary | ICD-10-CM

## 2022-12-20 LAB — COMPREHENSIVE METABOLIC PANEL
AG Ratio: 1.6 (calc) (ref 1.0–2.5)
ALT: 14 U/L (ref 9–46)
AST: 17 U/L (ref 10–40)
Albumin: 4.6 g/dL (ref 3.6–5.1)
Alkaline phosphatase (APISO): 82 U/L (ref 36–130)
BUN: 17 mg/dL (ref 7–25)
CO2: 29 mmol/L (ref 20–32)
Calcium: 9.7 mg/dL (ref 8.6–10.3)
Chloride: 106 mmol/L (ref 98–110)
Creat: 1.12 mg/dL (ref 0.60–1.24)
Globulin: 2.8 g/dL (ref 1.9–3.7)
Glucose, Bld: 93 mg/dL (ref 65–99)
Potassium: 4.1 mmol/L (ref 3.5–5.3)
Sodium: 142 mmol/L (ref 135–146)
Total Bilirubin: 0.4 mg/dL (ref 0.2–1.2)
Total Protein: 7.4 g/dL (ref 6.1–8.1)

## 2022-12-20 LAB — HIV ANTIBODY (ROUTINE TESTING W REFLEX): HIV 1&2 Ab, 4th Generation: NONREACTIVE

## 2022-12-20 LAB — RPR: RPR Ser Ql: NONREACTIVE

## 2023-02-15 ENCOUNTER — Telehealth: Payer: Self-pay | Admitting: Pharmacist

## 2023-02-15 NOTE — Telephone Encounter (Signed)
Received fax today stating patient required new PA for Truvada. Completed paper form and faxed over this morning. Received faxed approval today through 02/15/2024. Listed in media tab.   Margarite Gouge, PharmD, CPP, BCIDP, AAHIVP Clinical Pharmacist Practitioner Infectious Diseases Clinical Pharmacist [facility] for Infectious Disease

## 2023-03-27 ENCOUNTER — Other Ambulatory Visit (HOSPITAL_COMMUNITY): Payer: Self-pay

## 2023-03-27 ENCOUNTER — Ambulatory Visit: Payer: 59 | Admitting: Pharmacist

## 2023-03-28 ENCOUNTER — Ambulatory Visit: Payer: 59 | Admitting: Pharmacist

## 2023-04-05 ENCOUNTER — Ambulatory Visit (INDEPENDENT_AMBULATORY_CARE_PROVIDER_SITE_OTHER): Payer: 59 | Admitting: Pharmacist

## 2023-04-05 ENCOUNTER — Other Ambulatory Visit: Payer: Self-pay

## 2023-04-05 DIAGNOSIS — Z2981 Encounter for HIV pre-exposure prophylaxis: Secondary | ICD-10-CM

## 2023-04-05 NOTE — Progress Notes (Signed)
   HPI: Mark Proctor Mark Proctor is a 28 y.o. male who presents to the [facility] pharmacy clinic for HIV PrEP follow-up.  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  Modified Medications   No medications on file  Discontinued Medications   No medications on file       06/22/2022    3:46 PM  CHL HIV PREP FLOWSHEET RESULTS  Insurance Status Insured  How did you hear? PCP referral  Gender at birth Male  Gender identity cis-Male  Risk for HIV Condomless vaginal or anal intercourse  Sex Partners Men only  # sex partners past 3-6 mos 2  Sex activity preferences Receptive;Oral  Condom use No  Treated for STI? No  HIV symptoms? None  PrEP Eligibility Yes  Paper work received? Yes    Labs:  SCr: Lab Results  Component Value Date   CREATININE 1.12 12/19/2022   CREATININE 1.08 11/07/2021   CREATININE 1.06 09/14/2020   CREATININE 1.03 09/06/2019   CREATININE 1.25 (H) 09/04/2019   HIV Lab Results  Component Value Date   HIV NON-REACTIVE 12/19/2022   HIV NON-REACTIVE 09/21/2022   HIV NON-REACTIVE 06/22/2022   HIV Non Reactive 04/12/2017   Hepatitis B Lab Results  Component Value Date   HEPBSAB NON-REACTIVE 06/22/2022   HEPBSAG NON-REACTIVE 06/22/2022   Hepatitis C Lab Results  Component Value Date   HEPCAB NON-REACTIVE 06/22/2022   Hepatitis A Lab Results  Component Value Date   HAV REACTIVE (A) 06/22/2022   RPR and STI Lab Results  Component Value Date   LABRPR NON-REACTIVE 12/19/2022    LABRPR NON-REACTIVE 06/22/2022        No data to display          Assessment: Mark Proctor presents today for HIV PrEP 3 month follow-up. He is currently prescribed Truvada On-demand using 2-1-1 method and has had 3 sexual encounters since his last visit (10/24). He states adherence to appropriate 2-1-1 method and importance of this was reinforced. He says he does not need a refill at this time and will request a refill via MyChart if needed before his next visit (5/5). He expressed interest in starting Apretude after finishing his current Truvada prescription. Discussed the pros/cons of each as well as his eligibility for EXTEND 33M research study. Screened patient for acute HIV symptoms such as fatigue, muscle aches, rash, sore throat, lymphadenopathy, headache, night sweats, nausea/vomiting/diarrhea, and fever. Mark Proctor declines STI testing and would like to defer COVID, HepB, Mpox vaccinations today.   Plan: HIV ab test Follow up in 3 months (07/02/23)  Joesph Specking, PharmD PGY1 Pharmacy Resident 04/05/2023 9:43 AM

## 2023-04-06 LAB — HIV ANTIBODY (ROUTINE TESTING W REFLEX): HIV 1&2 Ab, 4th Generation: NONREACTIVE

## 2023-06-27 NOTE — Progress Notes (Deleted)
   HPI: Mark Proctor is a 28 y.o. male who presents to the [facility] pharmacy clinic for HIV PrEP follow-up.  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  Modified Medications   No medications on file  Discontinued Medications   No medications on file       06/22/2022    3:46 PM  CHL HIV PREP FLOWSHEET RESULTS  Insurance Status Insured  How did you hear? PCP referral  Gender at birth Male  Gender identity cis-Male  Risk for HIV Condomless vaginal or anal intercourse  Sex Partners Men only  # sex partners past 3-6 mos 2  Sex activity preferences Receptive;Oral  Condom use No  Treated for STI? No  HIV symptoms? None  PrEP Eligibility Yes  Paper work received? Yes    Labs:  SCr: Lab Results  Component Value Date   CREATININE 1.12 12/19/2022   CREATININE 1.08 11/07/2021   CREATININE 1.06 09/14/2020   CREATININE 1.03 09/06/2019   CREATININE 1.25 (H) 09/04/2019   HIV Lab Results  Component Value Date   HIV NON-REACTIVE 04/05/2023   HIV NON-REACTIVE 12/19/2022   HIV NON-REACTIVE 09/21/2022   HIV NON-REACTIVE 06/22/2022   HIV Non Reactive 04/12/2017   Hepatitis B Lab Results  Component Value Date   HEPBSAB NON-REACTIVE 06/22/2022   HEPBSAG NON-REACTIVE 06/22/2022   Hepatitis C Lab Results  Component Value Date   HEPCAB NON-REACTIVE 06/22/2022   Hepatitis A Lab Results  Component Value Date   HAV REACTIVE (A) 06/22/2022   RPR and STI Lab Results  Component Value Date    LABRPR NON-REACTIVE 12/19/2022   LABRPR NON-REACTIVE 06/22/2022        No data to display          Assessment: Mark Proctor is here today to follow up for HIV PrEP. Takes Truvada via the 2-1-1 method without any issues. He has had *** new partners since his last visit and has *** tablets of Truvada left. Screened for acute HIV symptoms such as fatigue, muscle aches, rash, sore throat, lymphadenopathy, headache, night sweats, nausea/vomiting/diarrhea, and fever. Denies any symptoms. STI testing***. Last SCr was checked in October 2024 and was within normal limits. Will need another on checked in October 2025.   Plan: - HIV antibody - Truvada refill - Follow up on ***  Adarian Bur L. Chassity Ludke, PharmD, BCIDP, AAHIVP, CPP Clinical Pharmacist Practitioner - Infectious Diseases Clinical Pharmacist Lead - Specialty Pharmacy [facility] for Infectious Disease 06/27/2023, 3:59 PM

## 2023-07-02 ENCOUNTER — Ambulatory Visit: Payer: Self-pay | Admitting: Pharmacist

## 2023-07-02 NOTE — Progress Notes (Unsigned)
    HPI: Mark Proctor is a 28 y.o. male who presents to the [facility] pharmacy clinic for HIV PrEP follow-up.  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  Modified Medications   No medications on file  Discontinued Medications   No medications on file       06/22/2022    3:46 PM  CHL HIV PREP FLOWSHEET RESULTS  Insurance Status Insured  How did you hear? PCP referral  Gender at birth Male  Gender identity cis-Male  Risk for HIV Condomless vaginal or anal intercourse  Sex Partners Men only  # sex partners past 3-6 mos 2  Sex activity preferences Receptive;Oral  Condom use No  Treated for STI? No  HIV symptoms? None  PrEP Eligibility Yes  Paper work received? Yes    Labs:  SCr: Lab Results  Component Value Date   CREATININE 1.12 12/19/2022   CREATININE 1.08 11/07/2021   CREATININE 1.06 09/14/2020   CREATININE 1.03 09/06/2019   CREATININE 1.25 (H) 09/04/2019   HIV Lab Results  Component Value Date   HIV NON-REACTIVE 04/05/2023   HIV NON-REACTIVE 12/19/2022   HIV NON-REACTIVE 09/21/2022   HIV NON-REACTIVE 06/22/2022   HIV Non Reactive 04/12/2017   Hepatitis B Lab Results  Component Value Date   HEPBSAB NON-REACTIVE 06/22/2022   HEPBSAG NON-REACTIVE 06/22/2022   Hepatitis C Lab Results  Component Value Date   HEPCAB NON-REACTIVE 06/22/2022   Hepatitis A Lab Results  Component Value Date   HAV REACTIVE (A) 06/22/2022   RPR and STI Lab Results  Component Value Date    LABRPR NON-REACTIVE 12/19/2022   LABRPR NON-REACTIVE 06/22/2022        No data to display          Assessment: Mark Proctor is here today to follow up for HIV PrEP. Takes Truvada via the 2-1-1 method without any issues. He has had *** new partners since his last visit and has *** tablets of Truvada left. Screened for acute HIV symptoms such as fatigue, muscle aches, rash, sore throat, lymphadenopathy, headache, night sweats, nausea/vomiting/diarrhea, and fever. Denies any symptoms. STI testing***. Last SCr was checked in October 2024 and was within normal limits. Will need another on checked in October 2025.   Plan: - HIV antibody - Truvada refill - Follow up on ***  Joli Koob L. Karishma Unrein, PharmD, BCIDP, AAHIVP, CPP Clinical Pharmacist Practitioner - Infectious Diseases Clinical Pharmacist Lead - Specialty Pharmacy [facility] for Infectious Disease 07/02/2023, 3:12 PM

## 2023-07-03 ENCOUNTER — Ambulatory Visit (INDEPENDENT_AMBULATORY_CARE_PROVIDER_SITE_OTHER): Admitting: Pharmacist

## 2023-07-03 ENCOUNTER — Other Ambulatory Visit: Payer: Self-pay

## 2023-07-03 DIAGNOSIS — Z113 Encounter for screening for infections with a predominantly sexual mode of transmission: Secondary | ICD-10-CM | POA: Diagnosis not present

## 2023-07-03 DIAGNOSIS — Z2981 Encounter for HIV pre-exposure prophylaxis: Secondary | ICD-10-CM | POA: Diagnosis not present

## 2023-07-04 LAB — HIV ANTIBODY (ROUTINE TESTING W REFLEX): HIV 1&2 Ab, 4th Generation: NONREACTIVE

## 2023-07-05 ENCOUNTER — Other Ambulatory Visit: Payer: Self-pay | Admitting: Pharmacist

## 2023-07-05 DIAGNOSIS — Z79899 Other long term (current) drug therapy: Secondary | ICD-10-CM

## 2023-07-05 MED ORDER — EMTRICITABINE-TENOFOVIR DF 200-300 MG PO TABS: 1.0000 | ORAL_TABLET | Freq: Every day | ORAL | 2 refills | Status: AC

## 2023-10-04 ENCOUNTER — Ambulatory Visit: Admitting: Pharmacist
